# Patient Record
Sex: Male | Born: 1980
Health system: Southern US, Community
[De-identification: ages and names within clinical notes are randomized; demographics above are authoritative.]

## PROBLEM LIST (undated history)

## (undated) DIAGNOSIS — T7840XA Allergy, unspecified, initial encounter: Secondary | ICD-10-CM

## (undated) DIAGNOSIS — J342 Deviated nasal septum: Secondary | ICD-10-CM

## (undated) DIAGNOSIS — G43909 Migraine, unspecified, not intractable, without status migrainosus: Secondary | ICD-10-CM

## (undated) DIAGNOSIS — F909 Attention-deficit hyperactivity disorder, unspecified type: Secondary | ICD-10-CM

## (undated) DIAGNOSIS — I10 Essential (primary) hypertension: Secondary | ICD-10-CM

## (undated) HISTORY — DX: Essential (primary) hypertension: I10

## (undated) HISTORY — PX: WISDOM TOOTH EXTRACTION: SHX21

## (undated) HISTORY — DX: Migraine, unspecified, not intractable, without status migrainosus: G43.909

## (undated) HISTORY — DX: Allergy, unspecified, initial encounter: T78.40XA

## (undated) HISTORY — PX: ADENOIDECTOMY: SUR15

## (undated) HISTORY — PX: SALIVARY GLAND SURGERY: SHX768

## (undated) HISTORY — PX: TONSILLECTOMY: SUR1361

---
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
## 2005-11-14 ENCOUNTER — Ambulatory Visit (HOSPITAL_COMMUNITY): Admission: RE | Admit: 2005-11-14 | Discharge: 2005-11-14 | Payer: Self-pay | Admitting: Chiropractic Medicine

## 2009-07-11 ENCOUNTER — Encounter: Admission: RE | Admit: 2009-07-11 | Discharge: 2009-07-11 | Payer: Self-pay | Admitting: Family Medicine

## 2018-05-01 DIAGNOSIS — Z0000 Encounter for general adult medical examination without abnormal findings: Secondary | ICD-10-CM | POA: Diagnosis not present

## 2018-05-08 DIAGNOSIS — Z23 Encounter for immunization: Secondary | ICD-10-CM | POA: Diagnosis not present

## 2018-05-08 DIAGNOSIS — I10 Essential (primary) hypertension: Secondary | ICD-10-CM | POA: Diagnosis not present

## 2018-05-08 DIAGNOSIS — Z0000 Encounter for general adult medical examination without abnormal findings: Secondary | ICD-10-CM | POA: Diagnosis not present

## 2018-05-08 DIAGNOSIS — Z6827 Body mass index (BMI) 27.0-27.9, adult: Secondary | ICD-10-CM | POA: Diagnosis not present

## 2018-06-12 DIAGNOSIS — R17 Unspecified jaundice: Secondary | ICD-10-CM | POA: Diagnosis not present

## 2018-06-12 DIAGNOSIS — I10 Essential (primary) hypertension: Secondary | ICD-10-CM | POA: Diagnosis not present

## 2018-06-19 MED FILL — VALSARTAN 40 MG TABLET: 40 | 7 days supply | Qty: 14 | Fill #0

## 2018-06-26 DIAGNOSIS — R945 Abnormal results of liver function studies: Secondary | ICD-10-CM | POA: Diagnosis not present

## 2018-06-26 DIAGNOSIS — I10 Essential (primary) hypertension: Secondary | ICD-10-CM | POA: Diagnosis not present

## 2018-06-26 DIAGNOSIS — Z6827 Body mass index (BMI) 27.0-27.9, adult: Secondary | ICD-10-CM | POA: Diagnosis not present

## 2018-06-26 MED FILL — VALSARTAN 40 MG TABLET: 40 | 90 days supply | Qty: 180 | Fill #0

## 2018-07-17 DIAGNOSIS — H5213 Myopia, bilateral: Secondary | ICD-10-CM | POA: Diagnosis not present

## 2018-08-07 DIAGNOSIS — M542 Cervicalgia: Secondary | ICD-10-CM | POA: Diagnosis not present

## 2018-08-07 DIAGNOSIS — M546 Pain in thoracic spine: Secondary | ICD-10-CM | POA: Diagnosis not present

## 2018-08-07 DIAGNOSIS — M9905 Segmental and somatic dysfunction of pelvic region: Secondary | ICD-10-CM | POA: Diagnosis not present

## 2018-08-07 DIAGNOSIS — M9903 Segmental and somatic dysfunction of lumbar region: Secondary | ICD-10-CM | POA: Diagnosis not present

## 2018-08-07 DIAGNOSIS — M9901 Segmental and somatic dysfunction of cervical region: Secondary | ICD-10-CM | POA: Diagnosis not present

## 2018-08-07 DIAGNOSIS — M7918 Myalgia, other site: Secondary | ICD-10-CM | POA: Diagnosis not present

## 2018-08-07 DIAGNOSIS — M9902 Segmental and somatic dysfunction of thoracic region: Secondary | ICD-10-CM | POA: Diagnosis not present

## 2018-08-07 DIAGNOSIS — M545 Low back pain: Secondary | ICD-10-CM | POA: Diagnosis not present

## 2018-08-21 DIAGNOSIS — M546 Pain in thoracic spine: Secondary | ICD-10-CM | POA: Diagnosis not present

## 2018-08-21 DIAGNOSIS — M9905 Segmental and somatic dysfunction of pelvic region: Secondary | ICD-10-CM | POA: Diagnosis not present

## 2018-08-21 DIAGNOSIS — M9903 Segmental and somatic dysfunction of lumbar region: Secondary | ICD-10-CM | POA: Diagnosis not present

## 2018-08-21 DIAGNOSIS — M542 Cervicalgia: Secondary | ICD-10-CM | POA: Diagnosis not present

## 2018-08-21 DIAGNOSIS — M9902 Segmental and somatic dysfunction of thoracic region: Secondary | ICD-10-CM | POA: Diagnosis not present

## 2018-08-21 DIAGNOSIS — M9901 Segmental and somatic dysfunction of cervical region: Secondary | ICD-10-CM | POA: Diagnosis not present

## 2018-08-21 DIAGNOSIS — M7918 Myalgia, other site: Secondary | ICD-10-CM | POA: Diagnosis not present

## 2018-08-21 DIAGNOSIS — M545 Low back pain: Secondary | ICD-10-CM | POA: Diagnosis not present

## 2018-09-11 DIAGNOSIS — M9903 Segmental and somatic dysfunction of lumbar region: Secondary | ICD-10-CM | POA: Diagnosis not present

## 2018-09-11 DIAGNOSIS — M9905 Segmental and somatic dysfunction of pelvic region: Secondary | ICD-10-CM | POA: Diagnosis not present

## 2018-09-11 DIAGNOSIS — M546 Pain in thoracic spine: Secondary | ICD-10-CM | POA: Diagnosis not present

## 2018-09-11 DIAGNOSIS — M7918 Myalgia, other site: Secondary | ICD-10-CM | POA: Diagnosis not present

## 2018-09-11 DIAGNOSIS — M9902 Segmental and somatic dysfunction of thoracic region: Secondary | ICD-10-CM | POA: Diagnosis not present

## 2018-09-11 DIAGNOSIS — M9901 Segmental and somatic dysfunction of cervical region: Secondary | ICD-10-CM | POA: Diagnosis not present

## 2018-09-11 DIAGNOSIS — M542 Cervicalgia: Secondary | ICD-10-CM | POA: Diagnosis not present

## 2018-09-11 DIAGNOSIS — M545 Low back pain: Secondary | ICD-10-CM | POA: Diagnosis not present

## 2018-09-22 MED FILL — VALSARTAN 40 MG TABLET: 40 | 90 days supply | Qty: 180 | Fill #1

## 2018-09-25 DIAGNOSIS — M545 Low back pain: Secondary | ICD-10-CM | POA: Diagnosis not present

## 2018-09-25 DIAGNOSIS — M542 Cervicalgia: Secondary | ICD-10-CM | POA: Diagnosis not present

## 2018-09-25 DIAGNOSIS — M9902 Segmental and somatic dysfunction of thoracic region: Secondary | ICD-10-CM | POA: Diagnosis not present

## 2018-09-25 DIAGNOSIS — M9903 Segmental and somatic dysfunction of lumbar region: Secondary | ICD-10-CM | POA: Diagnosis not present

## 2018-09-25 DIAGNOSIS — M9901 Segmental and somatic dysfunction of cervical region: Secondary | ICD-10-CM | POA: Diagnosis not present

## 2018-09-25 DIAGNOSIS — M546 Pain in thoracic spine: Secondary | ICD-10-CM | POA: Diagnosis not present

## 2018-09-25 DIAGNOSIS — M9905 Segmental and somatic dysfunction of pelvic region: Secondary | ICD-10-CM | POA: Diagnosis not present

## 2018-09-25 DIAGNOSIS — M7918 Myalgia, other site: Secondary | ICD-10-CM | POA: Diagnosis not present

## 2018-10-16 DIAGNOSIS — M9905 Segmental and somatic dysfunction of pelvic region: Secondary | ICD-10-CM | POA: Diagnosis not present

## 2018-10-16 DIAGNOSIS — M9901 Segmental and somatic dysfunction of cervical region: Secondary | ICD-10-CM | POA: Diagnosis not present

## 2018-10-16 DIAGNOSIS — M546 Pain in thoracic spine: Secondary | ICD-10-CM | POA: Diagnosis not present

## 2018-10-16 DIAGNOSIS — M545 Low back pain: Secondary | ICD-10-CM | POA: Diagnosis not present

## 2018-10-16 DIAGNOSIS — M542 Cervicalgia: Secondary | ICD-10-CM | POA: Diagnosis not present

## 2018-10-16 DIAGNOSIS — M7918 Myalgia, other site: Secondary | ICD-10-CM | POA: Diagnosis not present

## 2018-10-16 DIAGNOSIS — M9902 Segmental and somatic dysfunction of thoracic region: Secondary | ICD-10-CM | POA: Diagnosis not present

## 2018-10-16 DIAGNOSIS — M9903 Segmental and somatic dysfunction of lumbar region: Secondary | ICD-10-CM | POA: Diagnosis not present

## 2018-11-06 DIAGNOSIS — M546 Pain in thoracic spine: Secondary | ICD-10-CM | POA: Diagnosis not present

## 2018-11-06 DIAGNOSIS — M542 Cervicalgia: Secondary | ICD-10-CM | POA: Diagnosis not present

## 2018-11-06 DIAGNOSIS — M9905 Segmental and somatic dysfunction of pelvic region: Secondary | ICD-10-CM | POA: Diagnosis not present

## 2018-11-06 DIAGNOSIS — M7918 Myalgia, other site: Secondary | ICD-10-CM | POA: Diagnosis not present

## 2018-11-06 DIAGNOSIS — M9902 Segmental and somatic dysfunction of thoracic region: Secondary | ICD-10-CM | POA: Diagnosis not present

## 2018-11-06 DIAGNOSIS — M9903 Segmental and somatic dysfunction of lumbar region: Secondary | ICD-10-CM | POA: Diagnosis not present

## 2018-11-06 DIAGNOSIS — M9901 Segmental and somatic dysfunction of cervical region: Secondary | ICD-10-CM | POA: Diagnosis not present

## 2018-11-06 DIAGNOSIS — M545 Low back pain: Secondary | ICD-10-CM | POA: Diagnosis not present

## 2018-11-20 DIAGNOSIS — M9903 Segmental and somatic dysfunction of lumbar region: Secondary | ICD-10-CM | POA: Diagnosis not present

## 2018-11-20 DIAGNOSIS — M542 Cervicalgia: Secondary | ICD-10-CM | POA: Diagnosis not present

## 2018-11-20 DIAGNOSIS — M9901 Segmental and somatic dysfunction of cervical region: Secondary | ICD-10-CM | POA: Diagnosis not present

## 2018-11-20 DIAGNOSIS — M9902 Segmental and somatic dysfunction of thoracic region: Secondary | ICD-10-CM | POA: Diagnosis not present

## 2018-11-20 DIAGNOSIS — M7918 Myalgia, other site: Secondary | ICD-10-CM | POA: Diagnosis not present

## 2018-11-20 DIAGNOSIS — M545 Low back pain: Secondary | ICD-10-CM | POA: Diagnosis not present

## 2018-11-20 DIAGNOSIS — M9905 Segmental and somatic dysfunction of pelvic region: Secondary | ICD-10-CM | POA: Diagnosis not present

## 2018-11-20 DIAGNOSIS — M546 Pain in thoracic spine: Secondary | ICD-10-CM | POA: Diagnosis not present

## 2018-12-11 DIAGNOSIS — M9905 Segmental and somatic dysfunction of pelvic region: Secondary | ICD-10-CM | POA: Diagnosis not present

## 2018-12-11 DIAGNOSIS — M9902 Segmental and somatic dysfunction of thoracic region: Secondary | ICD-10-CM | POA: Diagnosis not present

## 2018-12-11 DIAGNOSIS — M545 Low back pain: Secondary | ICD-10-CM | POA: Diagnosis not present

## 2018-12-11 DIAGNOSIS — M7918 Myalgia, other site: Secondary | ICD-10-CM | POA: Diagnosis not present

## 2018-12-11 DIAGNOSIS — M542 Cervicalgia: Secondary | ICD-10-CM | POA: Diagnosis not present

## 2018-12-11 DIAGNOSIS — M9901 Segmental and somatic dysfunction of cervical region: Secondary | ICD-10-CM | POA: Diagnosis not present

## 2018-12-11 DIAGNOSIS — M546 Pain in thoracic spine: Secondary | ICD-10-CM | POA: Diagnosis not present

## 2018-12-11 DIAGNOSIS — M9903 Segmental and somatic dysfunction of lumbar region: Secondary | ICD-10-CM | POA: Diagnosis not present

## 2018-12-25 DIAGNOSIS — I10 Essential (primary) hypertension: Secondary | ICD-10-CM | POA: Diagnosis not present

## 2018-12-25 DIAGNOSIS — M546 Pain in thoracic spine: Secondary | ICD-10-CM | POA: Diagnosis not present

## 2018-12-25 DIAGNOSIS — M9901 Segmental and somatic dysfunction of cervical region: Secondary | ICD-10-CM | POA: Diagnosis not present

## 2018-12-25 DIAGNOSIS — M9902 Segmental and somatic dysfunction of thoracic region: Secondary | ICD-10-CM | POA: Diagnosis not present

## 2018-12-25 DIAGNOSIS — M9905 Segmental and somatic dysfunction of pelvic region: Secondary | ICD-10-CM | POA: Diagnosis not present

## 2018-12-25 DIAGNOSIS — M545 Low back pain: Secondary | ICD-10-CM | POA: Diagnosis not present

## 2018-12-25 DIAGNOSIS — M9903 Segmental and somatic dysfunction of lumbar region: Secondary | ICD-10-CM | POA: Diagnosis not present

## 2018-12-25 DIAGNOSIS — M7918 Myalgia, other site: Secondary | ICD-10-CM | POA: Diagnosis not present

## 2018-12-25 DIAGNOSIS — E785 Hyperlipidemia, unspecified: Secondary | ICD-10-CM | POA: Diagnosis not present

## 2018-12-25 DIAGNOSIS — M542 Cervicalgia: Secondary | ICD-10-CM | POA: Diagnosis not present

## 2018-12-28 MED FILL — VALSARTAN 40 MG TABLET: 40 | 5 days supply | Qty: 10 | Fill #0

## 2019-01-01 MED FILL — VALSARTAN 40 MG TABS: 40 | 15 days supply | Qty: 30 | Fill #0

## 2019-01-15 DIAGNOSIS — I10 Essential (primary) hypertension: Secondary | ICD-10-CM | POA: Diagnosis not present

## 2019-01-15 DIAGNOSIS — T7840XS Allergy, unspecified, sequela: Secondary | ICD-10-CM | POA: Diagnosis not present

## 2019-01-15 DIAGNOSIS — R945 Abnormal results of liver function studies: Secondary | ICD-10-CM | POA: Diagnosis not present

## 2019-01-15 DIAGNOSIS — T7840XA Allergy, unspecified, initial encounter: Secondary | ICD-10-CM | POA: Diagnosis not present

## 2019-01-15 DIAGNOSIS — Z6827 Body mass index (BMI) 27.0-27.9, adult: Secondary | ICD-10-CM | POA: Diagnosis not present

## 2019-01-15 DIAGNOSIS — E782 Mixed hyperlipidemia: Secondary | ICD-10-CM | POA: Diagnosis not present

## 2019-01-15 MED FILL — VALSARTAN 40 MG TABLET: 40 | 30 days supply | Qty: 60 | Fill #0

## 2019-02-05 DIAGNOSIS — M9901 Segmental and somatic dysfunction of cervical region: Secondary | ICD-10-CM | POA: Diagnosis not present

## 2019-02-05 DIAGNOSIS — M9902 Segmental and somatic dysfunction of thoracic region: Secondary | ICD-10-CM | POA: Diagnosis not present

## 2019-02-05 DIAGNOSIS — M545 Low back pain: Secondary | ICD-10-CM | POA: Diagnosis not present

## 2019-02-05 DIAGNOSIS — M546 Pain in thoracic spine: Secondary | ICD-10-CM | POA: Diagnosis not present

## 2019-02-05 DIAGNOSIS — M542 Cervicalgia: Secondary | ICD-10-CM | POA: Diagnosis not present

## 2019-02-05 DIAGNOSIS — M9905 Segmental and somatic dysfunction of pelvic region: Secondary | ICD-10-CM | POA: Diagnosis not present

## 2019-02-05 DIAGNOSIS — M9903 Segmental and somatic dysfunction of lumbar region: Secondary | ICD-10-CM | POA: Diagnosis not present

## 2019-02-05 DIAGNOSIS — M7918 Myalgia, other site: Secondary | ICD-10-CM | POA: Diagnosis not present

## 2019-02-15 MED FILL — VALSARTAN 40 MG TABLET: 40 | 30 days supply | Qty: 60 | Fill #1 | Status: TO

## 2019-03-15 MED FILL — VALSARTAN 40 MG TABLET: 40 | 30 days supply | Qty: 60 | Fill #0

## 2019-04-16 DIAGNOSIS — Z03818 Encounter for observation for suspected exposure to other biological agents ruled out: Secondary | ICD-10-CM | POA: Diagnosis not present

## 2019-04-19 MED FILL — LOSARTAN POTASSIUM 50 MG TA: 50 | 90 days supply | Qty: 90 | Fill #0

## 2019-05-14 DIAGNOSIS — R945 Abnormal results of liver function studies: Secondary | ICD-10-CM | POA: Diagnosis not present

## 2019-05-14 DIAGNOSIS — Z719 Counseling, unspecified: Secondary | ICD-10-CM | POA: Diagnosis not present

## 2019-05-14 DIAGNOSIS — I10 Essential (primary) hypertension: Secondary | ICD-10-CM | POA: Diagnosis not present

## 2019-07-02 DIAGNOSIS — I10 Essential (primary) hypertension: Secondary | ICD-10-CM | POA: Diagnosis not present

## 2019-07-02 DIAGNOSIS — Z7689 Persons encountering health services in other specified circumstances: Secondary | ICD-10-CM | POA: Diagnosis not present

## 2019-07-16 DIAGNOSIS — M9905 Segmental and somatic dysfunction of pelvic region: Secondary | ICD-10-CM | POA: Diagnosis not present

## 2019-07-16 DIAGNOSIS — M9902 Segmental and somatic dysfunction of thoracic region: Secondary | ICD-10-CM | POA: Diagnosis not present

## 2019-07-16 DIAGNOSIS — R945 Abnormal results of liver function studies: Secondary | ICD-10-CM | POA: Diagnosis not present

## 2019-07-16 DIAGNOSIS — E782 Mixed hyperlipidemia: Secondary | ICD-10-CM | POA: Diagnosis not present

## 2019-07-16 DIAGNOSIS — M9907 Segmental and somatic dysfunction of upper extremity: Secondary | ICD-10-CM | POA: Diagnosis not present

## 2019-07-16 DIAGNOSIS — I10 Essential (primary) hypertension: Secondary | ICD-10-CM | POA: Diagnosis not present

## 2019-07-16 DIAGNOSIS — M542 Cervicalgia: Secondary | ICD-10-CM | POA: Diagnosis not present

## 2019-07-16 DIAGNOSIS — M7918 Myalgia, other site: Secondary | ICD-10-CM | POA: Diagnosis not present

## 2019-07-16 DIAGNOSIS — M9903 Segmental and somatic dysfunction of lumbar region: Secondary | ICD-10-CM | POA: Diagnosis not present

## 2019-07-16 DIAGNOSIS — M9901 Segmental and somatic dysfunction of cervical region: Secondary | ICD-10-CM | POA: Diagnosis not present

## 2019-07-16 DIAGNOSIS — M545 Low back pain: Secondary | ICD-10-CM | POA: Diagnosis not present

## 2019-07-16 DIAGNOSIS — M546 Pain in thoracic spine: Secondary | ICD-10-CM | POA: Diagnosis not present

## 2019-07-19 MED FILL — LOSARTAN POTASSIUM 50 MG TA: 50 | 90 days supply | Qty: 90 | Fill #0

## 2019-07-30 DIAGNOSIS — H5213 Myopia, bilateral: Secondary | ICD-10-CM | POA: Diagnosis not present

## 2019-09-26 DIAGNOSIS — Z20828 Contact with and (suspected) exposure to other viral communicable diseases: Secondary | ICD-10-CM | POA: Diagnosis not present

## 2019-10-22 MED FILL — LOSARTAN POTASSIUM 50 MG TA: 50 | 90 days supply | Qty: 90 | Fill #1

## 2020-01-19 MED FILL — LOSARTAN POTASSIUM 50 MG TA: 50 | 90 days supply | Qty: 90 | Fill #2

## 2020-02-25 DIAGNOSIS — M5032 Other cervical disc degeneration, mid-cervical region, unspecified level: Secondary | ICD-10-CM | POA: Diagnosis not present

## 2020-02-25 DIAGNOSIS — M5134 Other intervertebral disc degeneration, thoracic region: Secondary | ICD-10-CM | POA: Diagnosis not present

## 2020-02-25 DIAGNOSIS — M5137 Other intervertebral disc degeneration, lumbosacral region: Secondary | ICD-10-CM | POA: Diagnosis not present

## 2020-02-25 DIAGNOSIS — M9901 Segmental and somatic dysfunction of cervical region: Secondary | ICD-10-CM | POA: Diagnosis not present

## 2020-02-25 DIAGNOSIS — M9903 Segmental and somatic dysfunction of lumbar region: Secondary | ICD-10-CM | POA: Diagnosis not present

## 2020-02-25 DIAGNOSIS — M9902 Segmental and somatic dysfunction of thoracic region: Secondary | ICD-10-CM | POA: Diagnosis not present

## 2020-02-25 DIAGNOSIS — M62830 Muscle spasm of back: Secondary | ICD-10-CM | POA: Diagnosis not present

## 2020-03-03 DIAGNOSIS — M9901 Segmental and somatic dysfunction of cervical region: Secondary | ICD-10-CM | POA: Diagnosis not present

## 2020-03-03 DIAGNOSIS — M9902 Segmental and somatic dysfunction of thoracic region: Secondary | ICD-10-CM | POA: Diagnosis not present

## 2020-03-03 DIAGNOSIS — M9903 Segmental and somatic dysfunction of lumbar region: Secondary | ICD-10-CM | POA: Diagnosis not present

## 2020-03-03 DIAGNOSIS — M5137 Other intervertebral disc degeneration, lumbosacral region: Secondary | ICD-10-CM | POA: Diagnosis not present

## 2020-03-03 DIAGNOSIS — M5032 Other cervical disc degeneration, mid-cervical region, unspecified level: Secondary | ICD-10-CM | POA: Diagnosis not present

## 2020-03-03 DIAGNOSIS — M5134 Other intervertebral disc degeneration, thoracic region: Secondary | ICD-10-CM | POA: Diagnosis not present

## 2020-03-03 DIAGNOSIS — M62830 Muscle spasm of back: Secondary | ICD-10-CM | POA: Diagnosis not present

## 2020-03-10 DIAGNOSIS — M9901 Segmental and somatic dysfunction of cervical region: Secondary | ICD-10-CM | POA: Diagnosis not present

## 2020-03-10 DIAGNOSIS — M9903 Segmental and somatic dysfunction of lumbar region: Secondary | ICD-10-CM | POA: Diagnosis not present

## 2020-03-10 DIAGNOSIS — M5032 Other cervical disc degeneration, mid-cervical region, unspecified level: Secondary | ICD-10-CM | POA: Diagnosis not present

## 2020-03-10 DIAGNOSIS — M62830 Muscle spasm of back: Secondary | ICD-10-CM | POA: Diagnosis not present

## 2020-03-10 DIAGNOSIS — M5134 Other intervertebral disc degeneration, thoracic region: Secondary | ICD-10-CM | POA: Diagnosis not present

## 2020-03-10 DIAGNOSIS — M5137 Other intervertebral disc degeneration, lumbosacral region: Secondary | ICD-10-CM | POA: Diagnosis not present

## 2020-03-10 DIAGNOSIS — M9902 Segmental and somatic dysfunction of thoracic region: Secondary | ICD-10-CM | POA: Diagnosis not present

## 2020-03-17 DIAGNOSIS — M9901 Segmental and somatic dysfunction of cervical region: Secondary | ICD-10-CM | POA: Diagnosis not present

## 2020-03-17 DIAGNOSIS — M9903 Segmental and somatic dysfunction of lumbar region: Secondary | ICD-10-CM | POA: Diagnosis not present

## 2020-03-17 DIAGNOSIS — M5032 Other cervical disc degeneration, mid-cervical region, unspecified level: Secondary | ICD-10-CM | POA: Diagnosis not present

## 2020-03-17 DIAGNOSIS — M5137 Other intervertebral disc degeneration, lumbosacral region: Secondary | ICD-10-CM | POA: Diagnosis not present

## 2020-03-17 DIAGNOSIS — M9902 Segmental and somatic dysfunction of thoracic region: Secondary | ICD-10-CM | POA: Diagnosis not present

## 2020-03-17 DIAGNOSIS — M5134 Other intervertebral disc degeneration, thoracic region: Secondary | ICD-10-CM | POA: Diagnosis not present

## 2020-03-17 DIAGNOSIS — M62830 Muscle spasm of back: Secondary | ICD-10-CM | POA: Diagnosis not present

## 2020-03-24 DIAGNOSIS — M9902 Segmental and somatic dysfunction of thoracic region: Secondary | ICD-10-CM | POA: Diagnosis not present

## 2020-03-24 DIAGNOSIS — M5134 Other intervertebral disc degeneration, thoracic region: Secondary | ICD-10-CM | POA: Diagnosis not present

## 2020-03-24 DIAGNOSIS — M5137 Other intervertebral disc degeneration, lumbosacral region: Secondary | ICD-10-CM | POA: Diagnosis not present

## 2020-03-24 DIAGNOSIS — M9901 Segmental and somatic dysfunction of cervical region: Secondary | ICD-10-CM | POA: Diagnosis not present

## 2020-03-24 DIAGNOSIS — M62830 Muscle spasm of back: Secondary | ICD-10-CM | POA: Diagnosis not present

## 2020-03-24 DIAGNOSIS — M5032 Other cervical disc degeneration, mid-cervical region, unspecified level: Secondary | ICD-10-CM | POA: Diagnosis not present

## 2020-03-24 DIAGNOSIS — M9903 Segmental and somatic dysfunction of lumbar region: Secondary | ICD-10-CM | POA: Diagnosis not present

## 2020-03-31 DIAGNOSIS — M9902 Segmental and somatic dysfunction of thoracic region: Secondary | ICD-10-CM | POA: Diagnosis not present

## 2020-03-31 DIAGNOSIS — M9903 Segmental and somatic dysfunction of lumbar region: Secondary | ICD-10-CM | POA: Diagnosis not present

## 2020-03-31 DIAGNOSIS — M5137 Other intervertebral disc degeneration, lumbosacral region: Secondary | ICD-10-CM | POA: Diagnosis not present

## 2020-03-31 DIAGNOSIS — M5032 Other cervical disc degeneration, mid-cervical region, unspecified level: Secondary | ICD-10-CM | POA: Diagnosis not present

## 2020-03-31 DIAGNOSIS — M5134 Other intervertebral disc degeneration, thoracic region: Secondary | ICD-10-CM | POA: Diagnosis not present

## 2020-03-31 DIAGNOSIS — M62830 Muscle spasm of back: Secondary | ICD-10-CM | POA: Diagnosis not present

## 2020-03-31 DIAGNOSIS — M9901 Segmental and somatic dysfunction of cervical region: Secondary | ICD-10-CM | POA: Diagnosis not present

## 2020-04-07 DIAGNOSIS — M9903 Segmental and somatic dysfunction of lumbar region: Secondary | ICD-10-CM | POA: Diagnosis not present

## 2020-04-07 DIAGNOSIS — M5032 Other cervical disc degeneration, mid-cervical region, unspecified level: Secondary | ICD-10-CM | POA: Diagnosis not present

## 2020-04-07 DIAGNOSIS — M9901 Segmental and somatic dysfunction of cervical region: Secondary | ICD-10-CM | POA: Diagnosis not present

## 2020-04-07 DIAGNOSIS — M9902 Segmental and somatic dysfunction of thoracic region: Secondary | ICD-10-CM | POA: Diagnosis not present

## 2020-04-07 DIAGNOSIS — M62830 Muscle spasm of back: Secondary | ICD-10-CM | POA: Diagnosis not present

## 2020-04-07 DIAGNOSIS — M5134 Other intervertebral disc degeneration, thoracic region: Secondary | ICD-10-CM | POA: Diagnosis not present

## 2020-04-07 DIAGNOSIS — M5137 Other intervertebral disc degeneration, lumbosacral region: Secondary | ICD-10-CM | POA: Diagnosis not present

## 2020-04-21 MED FILL — LOSARTAN POTASSIUM 50 MG TA: 50 | 90 days supply | Qty: 90 | Fill #3

## 2020-05-09 ENCOUNTER — Telehealth: Payer: Self-pay | Admitting: General Practice

## 2020-05-09 NOTE — Telephone Encounter (Signed)
Patient scheduled.

## 2020-05-09 NOTE — Telephone Encounter (Signed)
Pt called - he had a new pt appt / CPE scheduled in June - we called to reschedule due to Dr Jimmey Ralph not being here.  His appt was rescheduled for July but said that if he waited that long, then his insurance will go up.  Dr Jimmey Ralph has a lot of back to back virtual slots - I wanted to see if we could somehow help him out since its not his fault the appt had to get rescheduled.  He knows Dr Jimmey Ralph isn't here this week and that it may be Monday or Tues before he hears back from [place].

## 2020-05-12 DIAGNOSIS — M9902 Segmental and somatic dysfunction of thoracic region: Secondary | ICD-10-CM | POA: Diagnosis not present

## 2020-05-12 DIAGNOSIS — M5134 Other intervertebral disc degeneration, thoracic region: Secondary | ICD-10-CM | POA: Diagnosis not present

## 2020-05-12 DIAGNOSIS — M62830 Muscle spasm of back: Secondary | ICD-10-CM | POA: Diagnosis not present

## 2020-05-12 DIAGNOSIS — M5032 Other cervical disc degeneration, mid-cervical region, unspecified level: Secondary | ICD-10-CM | POA: Diagnosis not present

## 2020-05-12 DIAGNOSIS — M5137 Other intervertebral disc degeneration, lumbosacral region: Secondary | ICD-10-CM | POA: Diagnosis not present

## 2020-05-12 DIAGNOSIS — M9901 Segmental and somatic dysfunction of cervical region: Secondary | ICD-10-CM | POA: Diagnosis not present

## 2020-05-12 DIAGNOSIS — M9903 Segmental and somatic dysfunction of lumbar region: Secondary | ICD-10-CM | POA: Diagnosis not present

## 2020-05-16 ENCOUNTER — Encounter: Payer: Self-pay | Admitting: Family Medicine

## 2020-05-16 ENCOUNTER — Other Ambulatory Visit: Payer: Self-pay

## 2020-05-16 ENCOUNTER — Ambulatory Visit: Payer: 59 | Admitting: Family Medicine

## 2020-05-16 VITALS — BP 152/90 | HR 75 | Temp 98.2°F | Ht 71.0 in | Wt 199.2 lb

## 2020-05-16 DIAGNOSIS — Z6827 Body mass index (BMI) 27.0-27.9, adult: Secondary | ICD-10-CM

## 2020-05-16 DIAGNOSIS — Z0001 Encounter for general adult medical examination with abnormal findings: Secondary | ICD-10-CM | POA: Diagnosis not present

## 2020-05-16 DIAGNOSIS — G43809 Other migraine, not intractable, without status migrainosus: Secondary | ICD-10-CM | POA: Diagnosis not present

## 2020-05-16 DIAGNOSIS — I10 Essential (primary) hypertension: Secondary | ICD-10-CM

## 2020-05-16 DIAGNOSIS — E663 Overweight: Secondary | ICD-10-CM

## 2020-05-16 DIAGNOSIS — J302 Other seasonal allergic rhinitis: Secondary | ICD-10-CM | POA: Diagnosis not present

## 2020-05-16 DIAGNOSIS — Z13220 Encounter for screening for lipoid disorders: Secondary | ICD-10-CM | POA: Diagnosis not present

## 2020-05-16 LAB — COMPREHENSIVE METABOLIC PANEL
ALT: 24 U/L (ref 0–53)
AST: 21 U/L (ref 0–37)
Albumin: 4.8 g/dL (ref 3.5–5.2)
Alkaline Phosphatase: 45 U/L (ref 39–117)
BUN: 19 mg/dL (ref 6–23)
CO2: 27 mEq/L (ref 19–32)
Calcium: 9.4 mg/dL (ref 8.4–10.5)
Chloride: 107 mEq/L (ref 96–112)
Creatinine, Ser: 1.09 mg/dL (ref 0.40–1.50)
GFR: 76.27 mL/min (ref >60.00)
Glucose, Bld: 98 mg/dL (ref 70–99)
Potassium: 4.1 mEq/L (ref 3.5–5.1)
Sodium: 140 mEq/L (ref 135–145)
Total Bilirubin: 1.6 mg/dL — ABNORMAL HIGH (ref 0.2–1.2)
Total Protein: 6.8 g/dL (ref 6.0–8.3)

## 2020-05-16 LAB — TSH: TSH: 1.41 u[IU]/mL (ref 0.35–4.50)

## 2020-05-16 LAB — LIPID PANEL
Cholesterol: 162 mg/dL (ref 0–200)
HDL: 46.3 mg/dL (ref >39.00)
LDL Cholesterol: 96 mg/dL (ref 0–99)
NonHDL: 115.53
Total CHOL/HDL Ratio: 3
Triglycerides: 98 mg/dL (ref 0.0–149.0)
VLDL: 19.6 mg/dL (ref 0.0–40.0)

## 2020-05-16 LAB — CBC
HCT: 39.9 % (ref 39.0–52.0)
Hemoglobin: 13.6 g/dL (ref 13.0–17.0)
MCHC: 34 g/dL (ref 30.0–36.0)
MCV: 87.6 fl (ref 78.0–100.0)
Platelets: 254 10*3/uL (ref 150.0–400.0)
RBC: 4.55 Mil/uL (ref 4.22–5.81)
RDW: 13.4 % (ref 11.5–15.5)
WBC: 6.4 10*3/uL (ref 4.0–10.5)

## 2020-05-16 NOTE — Assessment & Plan Note (Signed)
Above goal today however has typically well controlled at work and at home.  Will check CBC, C met, TSH.  Continue home monitoring goal 140/90 or lower.  He would like to eventually wean off medications and is working on diet and exercise to help him achieve this goal.

## 2020-05-16 NOTE — Patient Instructions (Signed)
It was very nice to see you today!  We will check blood work today.  Keep in on your blood pressure and let me know if persistently 140/90 or higher.  Come back in year for your next checkup, sooner if needed.  Take care, Dr Jerline Pain  Please try these tips to maintain a healthy lifestyle:   Eat at least 3 REAL meals and 1-2 snacks per day.  Aim for no more than 5 hours between eating.  If you eat breakfast, please do so within one hour of getting up.    Each meal should contain half fruits/vegetables, one quarter protein, and one quarter carbs (no bigger than a computer mouse)   Cut down on sweet beverages. This includes juice, soda, and sweet tea.     Drink at least 1 glass of water with each meal and aim for at least 8 glasses per day   Exercise at least 150 minutes every week.    Preventive Care 37-37 Years Old, Male Preventive care refers to lifestyle choices and visits with your health care provider that can promote health and wellness. This includes:  A yearly physical exam. This is also called an annual well check.  Regular dental and eye exams.  Immunizations.  Screening for certain conditions.  Healthy lifestyle choices, such as eating a healthy diet, getting regular exercise, not using drugs or products that contain nicotine and tobacco, and limiting alcohol use. What can I expect for my preventive care visit? Physical exam Your health care provider will check:  Height and weight. These may be used to calculate body mass index (BMI), which is a measurement that tells if you are at a healthy weight.  Heart rate and blood pressure.  Your skin for abnormal spots. Counseling Your health care provider may ask you questions about:  Alcohol, tobacco, and drug use.  Emotional well-being.  Home and relationship well-being.  Sexual activity.  Eating habits.  Work and work Statistician. What immunizations do I need?  Influenza (flu) vaccine  This is  recommended every year. Tetanus, diphtheria, and pertussis (Tdap) vaccine  You may need a Td booster every 10 years. Varicella (chickenpox) vaccine  You may need this vaccine if you have not already been vaccinated. Human papillomavirus (HPV) vaccine  If recommended by your health care provider, you may need three doses over 6 months. Measles, mumps, and rubella (MMR) vaccine  You may need at least one dose of MMR. You may also need a second dose. Meningococcal conjugate (MenACWY) vaccine  One dose is recommended if you are 37-37 years old and a Market researcher living in a residence hall, or if you have one of several medical conditions. You may also need additional booster doses. Pneumococcal conjugate (PCV13) vaccine  You may need this if you have certain conditions and were not previously vaccinated. Pneumococcal polysaccharide (PPSV23) vaccine  You may need one or two doses if you smoke cigarettes or if you have certain conditions. Hepatitis A vaccine  You may need this if you have certain conditions or if you travel or work in places where you may be exposed to hepatitis A. Hepatitis B vaccine  You may need this if you have certain conditions or if you travel or work in places where you may be exposed to hepatitis B. Haemophilus influenzae type b (Hib) vaccine  You may need this if you have certain risk factors. You may receive vaccines as individual doses or as more than one vaccine together in  one shot (combination vaccines). Talk with your health care provider about the risks and benefits of combination vaccines. What tests do I need? Blood tests  Lipid and cholesterol levels. These may be checked every 5 years starting at age 37.  Hepatitis C test.  Hepatitis B test. Screening   Diabetes screening. This is done by checking your blood sugar (glucose) after you have not eaten for a while (fasting).  Sexually transmitted disease (STD) testing. Talk with  your health care provider about your test results, treatment options, and if necessary, the need for more tests. Follow these instructions at home: Eating and drinking   Eat a diet that includes fresh fruits and vegetables, whole grains, lean protein, and low-fat dairy products.  Take vitamin and mineral supplements as recommended by your health care provider.  Do not drink alcohol if your health care provider tells you not to drink.  If you drink alcohol: ? Limit how much you have to 0-2 drinks a day. ? Be aware of how much alcohol is in your drink. In the [place] one drink equals one 12 oz bottle of beer (355 mL), one 5 oz glass of wine (148 mL), or one 1 oz glass of hard liquor (44 mL). Lifestyle  Take daily care of your teeth and gums.  Stay active. Exercise for at least 30 minutes on 5 or more days each week.  Do not use any products that contain nicotine or tobacco, such as cigarettes, e-cigarettes, and chewing tobacco. If you need help quitting, ask your health care provider.  If you are sexually active, practice safe sex. Use a condom or other form of protection to prevent STIs (sexually transmitted infections). What's next?  Go to your health care provider once a year for a well check visit.  Ask your health care provider how often you should have your eyes and teeth checked.  Stay up to date on all vaccines. This information is not intended to replace advice given to you by your health care provider. Make sure you discuss any questions you have with your health care provider. Document Revised: 11/12/2018 Document Reviewed: 11/12/2018 Elsevier Patient Education  2020 [employer].

## 2020-05-16 NOTE — Progress Notes (Signed)
Chief Complaint:  Jason [name] is a 39 y.o. male who presents today for his annual comprehensive physical exam.    Assessment/Plan:  Chronic Problems Addressed Today: Cervicogenic migraine Stable.  Has seen chiropractor which seems to be helping.  Seasonal allergies Stable.  Can use over-the-counter meds as needed.  Essential hypertension Above goal today however has typically well controlled at work and at home.  Will check CBC, C met, TSH.  Continue home monitoring goal 140/90 or lower.  He would like to eventually wean off medications and is working on diet and exercise to help him achieve this goal.   Body mass index is 27.78 kg/m. / Overweight  BMI Metric Follow Up - 05/16/20 1153      BMI Metric Follow Up-Please document annually   BMI Metric Follow Up Education provided            Preventative Healthcare: Check CBC, C met, TSH, lipid panel.  Patient Counseling(The following topics were reviewed and/or handout was given):  -Nutrition: Stressed importance of moderation in sodium/caffeine intake, saturated fat and cholesterol, caloric balance, sufficient intake of fresh fruits, vegetables, and fiber.  -Stressed the importance of regular exercise.   -Substance Abuse: Discussed cessation/primary prevention of tobacco, alcohol, or other drug use; driving or other dangerous activities under the influence; availability of treatment for abuse.   -Injury prevention: Discussed safety belts, safety helmets, smoke detector, smoking near bedding or upholstery.   -Sexuality: Discussed sexually transmitted diseases, partner selection, use of condoms, avoidance of unintended pregnancy and contraceptive alternatives.   -Dental health: Discussed importance of regular tooth brushing, flossing, and dental visits.  -Health maintenance and immunizations reviewed. Please refer to Health maintenance section.  Return to care in 1 year for next preventative visit.     Subjective:   HPI:  He has no acute complaints today.   See A/p for status of chronic conditions.   Lifestyle Diet: Balanced. Plenty of fruits and vegetables.  Exercise: Starting to work out more. Working orange of theory.   No flowsheet data found.  Health Maintenance Due  Topic Date Due  . Hepatitis C Screening  Never done  . HIV Screening  Never done     ROS: Per HPI, otherwise a complete review of systems was negative.   PMH:  The following were reviewed and entered/updated in epic: Past Medical History:  Diagnosis Date  . Allergy   . Hypertension   . Migraine    Patient Active Problem List   Diagnosis Date Noted  . Essential hypertension 05/16/2020  . Seasonal allergies 05/16/2020  . 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   Past Surgical History:  Procedure Laterality Date  . ADENOIDECTOMY    . SALIVARY GLAND SURGERY    . TONSILLECTOMY    . WISDOM TOOTH EXTRACTION      Family History  Problem Relation Age of Onset  . Skin cancer Father   . Stroke Paternal Grandmother   . Stroke Paternal Grandfather   . Parkinson's disease Paternal Grandfather     Medications- reviewed and updated Current Outpatient Medications  Medication Sig Dispense Refill  . fluticasone (FLONASE) 50 MCG/ACT nasal spray Place into both nostrils daily.    Marland Kitchen loratadine (CLARITIN) 10 MG tablet Take 10 mg by mouth daily.    Marland Kitchen losartan (COZAAR) 50 MG tablet Take 50 mg by mouth daily.    . Melatonin ([facility] MELATONIN) 5 MG CAPS Take by mouth as needed.    . Multiple Vitamin (MULTIVITAMIN ADULT PO) Take  by mouth.     No current facility-administered medications for this visit.    Allergies-reviewed and updated Allergies  Allergen Reactions  . Penicillins     Social History   Socioeconomic History  . Marital status: Married    Spouse name: Not on file  . Number of children: Not on file  . Years of education: Not on file  . Highest education level: Not on file  Occupational History  .  Occupation: Pharmacist, community  Tobacco Use  . Smoking status: Never Smoker  . Smokeless tobacco: Never Used  Vaping Use  . Vaping Use: Never used  Substance and Sexual Activity  . Alcohol use: Yes  . Drug use: Not on file  . Sexual activity: Not on file  Other Topics Concern  . Not on file  Social History Narrative  . Not on file   Social Determinants of Health   Financial Resource Strain:   . Difficulty of Paying Living Expenses:   Food Insecurity:   . Worried About Charity fundraiser in the Last Year:   . Arboriculturist in the Last Year:   Transportation Needs:   . Film/video editor (Medical):   Marland Kitchen Lack of Transportation (Non-Medical):   Physical Activity:   . Days of Exercise per Week:   . Minutes of Exercise per Session:   Stress:   . Feeling of Stress :   Social Connections:   . Frequency of Communication with Friends and Family:   . Frequency of Social Gatherings with Friends and Family:   . Attends Religious Services:   . Active Member of Clubs or Organizations:   . Attends Archivist Meetings:   Marland Kitchen Marital Status:         Objective:  Physical Exam: BP (!) 152/90   Pulse 75   Temp 98.2 F (36.8 C)   Ht 5' 11"  (1.803 m)   Wt 199 lb 3.2 oz (90.4 kg)   SpO2 98%   BMI 27.78 kg/m   Body mass index is 27.78 kg/m. Wt Readings from Last 3 Encounters:  05/16/20 199 lb 3.2 oz (90.4 kg)   Gen: NAD, resting comfortably HEENT: TMs normal bilaterally. OP clear. No thyromegaly noted.  CV: RRR with no murmurs appreciated Pulm: NWOB, CTAB with no crackles, wheezes, or rhonchi GI: Normal bowel sounds present. Soft, Nontender, Nondistended. MSK: no edema, cyanosis, or clubbing noted Skin: warm, dry Neuro: CN2-12 grossly intact. Strength 5/5 in upper and lower extremities. Reflexes symmetric and intact bilaterally.  Psych: Normal affect and thought content     Felicha Frayne M. Jerline Pain, MD 05/16/2020 11:53 AM

## 2020-05-16 NOTE — Assessment & Plan Note (Signed)
Stable.  Can use over-the-counter meds as needed.

## 2020-05-16 NOTE — Assessment & Plan Note (Signed)
Stable.  Has seen chiropractor which seems to be helping.

## 2020-05-17 ENCOUNTER — Other Ambulatory Visit: Payer: Self-pay

## 2020-05-17 DIAGNOSIS — R17 Unspecified jaundice: Secondary | ICD-10-CM

## 2020-05-17 NOTE — Progress Notes (Signed)
Please inform patient of the following:  His bilirubin was up but everything else is NORMAL. Elevated bilirubin is usually something that is hereditary. If he has been told that he has elevated bilirubin in the past we do not need to do anything else. If not, recommended that he come in for fractionated bilirubin to make sure that there is not anything else going on. Please place future order if needed.   Katina Degree. Jimmey Ralph, MD 05/17/2020 8:16 AM

## 2020-05-19 ENCOUNTER — Ambulatory Visit: Payer: Self-pay | Admitting: Family Medicine

## 2020-05-19 ENCOUNTER — Other Ambulatory Visit: Payer: Self-pay

## 2020-05-19 ENCOUNTER — Other Ambulatory Visit (INDEPENDENT_AMBULATORY_CARE_PROVIDER_SITE_OTHER): Payer: 59

## 2020-05-19 DIAGNOSIS — R17 Unspecified jaundice: Secondary | ICD-10-CM

## 2020-05-19 LAB — BILIRUBIN, FRACTIONATED(TOT/DIR/INDIR)
Bilirubin, Direct: 0.3 mg/dL — ABNORMAL HIGH (ref 0.0–0.2)
Indirect Bilirubin: 2.1 mg/dL (calc) — ABNORMAL HIGH (ref 0.2–1.2)
Total Bilirubin: 2.4 mg/dL — ABNORMAL HIGH (ref 0.2–1.2)

## 2020-05-23 NOTE — Progress Notes (Signed)
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 I would like to do one more set of labs to make sure there is nothing else going on. Please place future order for fractionated bilirubin, CBC, peripheral smear, reticulocyte count, haptoglobin, and CMET.

## 2020-05-25 ENCOUNTER — Other Ambulatory Visit: Payer: Self-pay | Admitting: *Deleted

## 2020-05-25 DIAGNOSIS — R17 Unspecified jaundice: Secondary | ICD-10-CM

## 2020-05-26 ENCOUNTER — Other Ambulatory Visit (INDEPENDENT_AMBULATORY_CARE_PROVIDER_SITE_OTHER): Payer: 59

## 2020-05-26 ENCOUNTER — Other Ambulatory Visit: Payer: Self-pay

## 2020-05-26 DIAGNOSIS — R17 Unspecified jaundice: Secondary | ICD-10-CM

## 2020-05-26 LAB — COMPREHENSIVE METABOLIC PANEL
ALT: 17 U/L (ref 0–53)
AST: 13 U/L (ref 0–37)
Albumin: 4.5 g/dL (ref 3.5–5.2)
Alkaline Phosphatase: 45 U/L (ref 39–117)
BUN: 16 mg/dL (ref 6–23)
CO2: 26 mEq/L (ref 19–32)
Calcium: 9.1 mg/dL (ref 8.4–10.5)
Chloride: 106 mEq/L (ref 96–112)
Creatinine, Ser: 1.13 mg/dL (ref 0.40–1.50)
GFR: 73.15 mL/min (ref >60.00)
Glucose, Bld: 83 mg/dL (ref 70–99)
Potassium: 3.9 mEq/L (ref 3.5–5.1)
Sodium: 141 mEq/L (ref 135–145)
Total Bilirubin: 1.8 mg/dL — ABNORMAL HIGH (ref 0.2–1.2)
Total Protein: 6.2 g/dL (ref 6.0–8.3)

## 2020-05-26 LAB — BILIRUBIN, FRACTIONATED(TOT/DIR/INDIR)
Bilirubin, Direct: 0.3 mg/dL — ABNORMAL HIGH (ref 0.0–0.2)
Indirect Bilirubin: 1.9 mg/dL (calc) — ABNORMAL HIGH (ref 0.2–1.2)
Total Bilirubin: 2.2 mg/dL — ABNORMAL HIGH (ref 0.2–1.2)

## 2020-05-26 LAB — CBC WITH DIFFERENTIAL/PLATELET
Basophils Absolute: 0 10*3/uL (ref 0.0–0.1)
Basophils Relative: 0.8 % (ref 0.0–3.0)
Eosinophils Absolute: 0.1 10*3/uL (ref 0.0–0.7)
Eosinophils Relative: 1.7 % (ref 0.0–5.0)
HCT: 40.5 % (ref 39.0–52.0)
Hemoglobin: 13.8 g/dL (ref 13.0–17.0)
Lymphocytes Relative: 29.8 % (ref 12.0–46.0)
Lymphs Abs: 1.8 10*3/uL (ref 0.7–4.0)
MCHC: 34.1 g/dL (ref 30.0–36.0)
MCV: 87.5 fl (ref 78.0–100.0)
Monocytes Absolute: 0.5 10*3/uL (ref 0.1–1.0)
Monocytes Relative: 8.3 % (ref 3.0–12.0)
Neutro Abs: 3.5 10*3/uL (ref 1.4–7.7)
Neutrophils Relative %: 59.4 % (ref 43.0–77.0)
Platelets: 234 10*3/uL (ref 150.0–400.0)
RBC: 4.63 Mil/uL (ref 4.22–5.81)
RDW: 13.4 % (ref 11.5–15.5)
WBC: 5.9 10*3/uL (ref 4.0–10.5)

## 2020-05-26 NOTE — Addendum Note (Signed)
Addended by: Tharon Aquas on: 05/26/2020 08:56 AM   Modules accepted: Orders

## 2020-05-29 LAB — RETICULOCYTES
ABS Retic: 51590 cells/uL (ref 25000–9000)
Retic Ct Pct: 1.1 %

## 2020-05-29 LAB — PATHOLOGIST SMEAR REVIEW

## 2020-05-29 LAB — HAPTOGLOBIN: Haptoglobin: 86 mg/dL (ref 43–212)

## 2020-05-30 ENCOUNTER — Encounter: Payer: Self-pay | Admitting: Family Medicine

## 2020-05-30 NOTE — Progress Notes (Signed)
Please inform patient of the following:  Blood work is all stable. 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 Do not need to do any further testing at this time.  Jason [name]. Jimmey Ralph, MD 05/30/2020 10:41 AM

## 2020-05-31 ENCOUNTER — Encounter: Payer: Self-pay | Admitting: Family Medicine

## 2020-06-09 ENCOUNTER — Ambulatory Visit: Payer: Self-pay | Admitting: Family Medicine

## 2020-06-09 DIAGNOSIS — M5137 Other intervertebral disc degeneration, lumbosacral region: Secondary | ICD-10-CM | POA: Diagnosis not present

## 2020-06-09 DIAGNOSIS — M5134 Other intervertebral disc degeneration, thoracic region: Secondary | ICD-10-CM | POA: Diagnosis not present

## 2020-06-09 DIAGNOSIS — M9902 Segmental and somatic dysfunction of thoracic region: Secondary | ICD-10-CM | POA: Diagnosis not present

## 2020-06-09 DIAGNOSIS — M9903 Segmental and somatic dysfunction of lumbar region: Secondary | ICD-10-CM | POA: Diagnosis not present

## 2020-06-09 DIAGNOSIS — M62830 Muscle spasm of back: Secondary | ICD-10-CM | POA: Diagnosis not present

## 2020-06-09 DIAGNOSIS — M9901 Segmental and somatic dysfunction of cervical region: Secondary | ICD-10-CM | POA: Diagnosis not present

## 2020-06-09 DIAGNOSIS — M5032 Other cervical disc degeneration, mid-cervical region, unspecified level: Secondary | ICD-10-CM | POA: Diagnosis not present

## 2020-06-16 DIAGNOSIS — M62830 Muscle spasm of back: Secondary | ICD-10-CM | POA: Diagnosis not present

## 2020-06-16 DIAGNOSIS — M5134 Other intervertebral disc degeneration, thoracic region: Secondary | ICD-10-CM | POA: Diagnosis not present

## 2020-06-16 DIAGNOSIS — M5032 Other cervical disc degeneration, mid-cervical region, unspecified level: Secondary | ICD-10-CM | POA: Diagnosis not present

## 2020-06-16 DIAGNOSIS — M9901 Segmental and somatic dysfunction of cervical region: Secondary | ICD-10-CM | POA: Diagnosis not present

## 2020-06-16 DIAGNOSIS — M9903 Segmental and somatic dysfunction of lumbar region: Secondary | ICD-10-CM | POA: Diagnosis not present

## 2020-06-16 DIAGNOSIS — M9902 Segmental and somatic dysfunction of thoracic region: Secondary | ICD-10-CM | POA: Diagnosis not present

## 2020-06-16 DIAGNOSIS — M5137 Other intervertebral disc degeneration, lumbosacral region: Secondary | ICD-10-CM | POA: Diagnosis not present

## 2020-06-30 DIAGNOSIS — M5032 Other cervical disc degeneration, mid-cervical region, unspecified level: Secondary | ICD-10-CM | POA: Diagnosis not present

## 2020-06-30 DIAGNOSIS — M62830 Muscle spasm of back: Secondary | ICD-10-CM | POA: Diagnosis not present

## 2020-06-30 DIAGNOSIS — M9902 Segmental and somatic dysfunction of thoracic region: Secondary | ICD-10-CM | POA: Diagnosis not present

## 2020-06-30 DIAGNOSIS — M5137 Other intervertebral disc degeneration, lumbosacral region: Secondary | ICD-10-CM | POA: Diagnosis not present

## 2020-06-30 DIAGNOSIS — M9903 Segmental and somatic dysfunction of lumbar region: Secondary | ICD-10-CM | POA: Diagnosis not present

## 2020-06-30 DIAGNOSIS — M9901 Segmental and somatic dysfunction of cervical region: Secondary | ICD-10-CM | POA: Diagnosis not present

## 2020-06-30 DIAGNOSIS — M5134 Other intervertebral disc degeneration, thoracic region: Secondary | ICD-10-CM | POA: Diagnosis not present

## 2020-07-14 DIAGNOSIS — M5137 Other intervertebral disc degeneration, lumbosacral region: Secondary | ICD-10-CM | POA: Diagnosis not present

## 2020-07-14 DIAGNOSIS — M62830 Muscle spasm of back: Secondary | ICD-10-CM | POA: Diagnosis not present

## 2020-07-14 DIAGNOSIS — M9902 Segmental and somatic dysfunction of thoracic region: Secondary | ICD-10-CM | POA: Diagnosis not present

## 2020-07-14 DIAGNOSIS — M9903 Segmental and somatic dysfunction of lumbar region: Secondary | ICD-10-CM | POA: Diagnosis not present

## 2020-07-14 DIAGNOSIS — M5032 Other cervical disc degeneration, mid-cervical region, unspecified level: Secondary | ICD-10-CM | POA: Diagnosis not present

## 2020-07-14 DIAGNOSIS — M5134 Other intervertebral disc degeneration, thoracic region: Secondary | ICD-10-CM | POA: Diagnosis not present

## 2020-07-14 DIAGNOSIS — M9901 Segmental and somatic dysfunction of cervical region: Secondary | ICD-10-CM | POA: Diagnosis not present

## 2020-07-23 ENCOUNTER — Encounter: Payer: Self-pay | Admitting: Family Medicine

## 2020-07-24 ENCOUNTER — Other Ambulatory Visit: Payer: Self-pay | Admitting: *Deleted

## 2020-07-24 MED ORDER — LOSARTAN POTASSIUM 50 MG PO TABS: 50.0000 mg | ORAL_TABLET | Freq: Every day | ORAL | 0 refills | Status: DC

## 2020-07-24 MED FILL — LOSARTAN POTASSIUM 50 MG TA: 50 | 90 days supply | Qty: 90 | Fill #0

## 2020-07-28 DIAGNOSIS — M5137 Other intervertebral disc degeneration, lumbosacral region: Secondary | ICD-10-CM | POA: Diagnosis not present

## 2020-07-28 DIAGNOSIS — M9902 Segmental and somatic dysfunction of thoracic region: Secondary | ICD-10-CM | POA: Diagnosis not present

## 2020-07-28 DIAGNOSIS — M5134 Other intervertebral disc degeneration, thoracic region: Secondary | ICD-10-CM | POA: Diagnosis not present

## 2020-07-28 DIAGNOSIS — M9901 Segmental and somatic dysfunction of cervical region: Secondary | ICD-10-CM | POA: Diagnosis not present

## 2020-07-28 DIAGNOSIS — M62830 Muscle spasm of back: Secondary | ICD-10-CM | POA: Diagnosis not present

## 2020-07-28 DIAGNOSIS — M9903 Segmental and somatic dysfunction of lumbar region: Secondary | ICD-10-CM | POA: Diagnosis not present

## 2020-07-28 DIAGNOSIS — M5032 Other cervical disc degeneration, mid-cervical region, unspecified level: Secondary | ICD-10-CM | POA: Diagnosis not present

## 2020-08-04 DIAGNOSIS — H5213 Myopia, bilateral: Secondary | ICD-10-CM | POA: Diagnosis not present

## 2020-08-11 DIAGNOSIS — M9901 Segmental and somatic dysfunction of cervical region: Secondary | ICD-10-CM | POA: Diagnosis not present

## 2020-08-11 DIAGNOSIS — M62830 Muscle spasm of back: Secondary | ICD-10-CM | POA: Diagnosis not present

## 2020-08-11 DIAGNOSIS — M9903 Segmental and somatic dysfunction of lumbar region: Secondary | ICD-10-CM | POA: Diagnosis not present

## 2020-08-11 DIAGNOSIS — M5137 Other intervertebral disc degeneration, lumbosacral region: Secondary | ICD-10-CM | POA: Diagnosis not present

## 2020-08-11 DIAGNOSIS — M5134 Other intervertebral disc degeneration, thoracic region: Secondary | ICD-10-CM | POA: Diagnosis not present

## 2020-08-11 DIAGNOSIS — M9902 Segmental and somatic dysfunction of thoracic region: Secondary | ICD-10-CM | POA: Diagnosis not present

## 2020-08-11 DIAGNOSIS — M5032 Other cervical disc degeneration, mid-cervical region, unspecified level: Secondary | ICD-10-CM | POA: Diagnosis not present

## 2020-09-01 DIAGNOSIS — M9903 Segmental and somatic dysfunction of lumbar region: Secondary | ICD-10-CM | POA: Diagnosis not present

## 2020-09-01 DIAGNOSIS — M5137 Other intervertebral disc degeneration, lumbosacral region: Secondary | ICD-10-CM | POA: Diagnosis not present

## 2020-09-01 DIAGNOSIS — M5134 Other intervertebral disc degeneration, thoracic region: Secondary | ICD-10-CM | POA: Diagnosis not present

## 2020-09-01 DIAGNOSIS — M5032 Other cervical disc degeneration, mid-cervical region, unspecified level: Secondary | ICD-10-CM | POA: Diagnosis not present

## 2020-09-01 DIAGNOSIS — M62830 Muscle spasm of back: Secondary | ICD-10-CM | POA: Diagnosis not present

## 2020-09-01 DIAGNOSIS — M9902 Segmental and somatic dysfunction of thoracic region: Secondary | ICD-10-CM | POA: Diagnosis not present

## 2020-09-01 DIAGNOSIS — M9901 Segmental and somatic dysfunction of cervical region: Secondary | ICD-10-CM | POA: Diagnosis not present

## 2020-09-15 DIAGNOSIS — M9901 Segmental and somatic dysfunction of cervical region: Secondary | ICD-10-CM | POA: Diagnosis not present

## 2020-09-15 DIAGNOSIS — M5032 Other cervical disc degeneration, mid-cervical region, unspecified level: Secondary | ICD-10-CM | POA: Diagnosis not present

## 2020-09-15 DIAGNOSIS — M62830 Muscle spasm of back: Secondary | ICD-10-CM | POA: Diagnosis not present

## 2020-09-15 DIAGNOSIS — M9903 Segmental and somatic dysfunction of lumbar region: Secondary | ICD-10-CM | POA: Diagnosis not present

## 2020-09-15 DIAGNOSIS — M5137 Other intervertebral disc degeneration, lumbosacral region: Secondary | ICD-10-CM | POA: Diagnosis not present

## 2020-09-15 DIAGNOSIS — M5134 Other intervertebral disc degeneration, thoracic region: Secondary | ICD-10-CM | POA: Diagnosis not present

## 2020-09-15 DIAGNOSIS — M9902 Segmental and somatic dysfunction of thoracic region: Secondary | ICD-10-CM | POA: Diagnosis not present

## 2020-09-29 DIAGNOSIS — M9902 Segmental and somatic dysfunction of thoracic region: Secondary | ICD-10-CM | POA: Diagnosis not present

## 2020-09-29 DIAGNOSIS — M5134 Other intervertebral disc degeneration, thoracic region: Secondary | ICD-10-CM | POA: Diagnosis not present

## 2020-09-29 DIAGNOSIS — M5137 Other intervertebral disc degeneration, lumbosacral region: Secondary | ICD-10-CM | POA: Diagnosis not present

## 2020-09-29 DIAGNOSIS — M9901 Segmental and somatic dysfunction of cervical region: Secondary | ICD-10-CM | POA: Diagnosis not present

## 2020-09-29 DIAGNOSIS — M5032 Other cervical disc degeneration, mid-cervical region, unspecified level: Secondary | ICD-10-CM | POA: Diagnosis not present

## 2020-09-29 DIAGNOSIS — M62830 Muscle spasm of back: Secondary | ICD-10-CM | POA: Diagnosis not present

## 2020-09-29 DIAGNOSIS — M9903 Segmental and somatic dysfunction of lumbar region: Secondary | ICD-10-CM | POA: Diagnosis not present

## 2020-10-17 ENCOUNTER — Other Ambulatory Visit (HOSPITAL_COMMUNITY): Payer: Self-pay | Admitting: Family Medicine

## 2020-10-17 ENCOUNTER — Encounter: Payer: Self-pay | Admitting: Family Medicine

## 2020-10-17 ENCOUNTER — Other Ambulatory Visit: Payer: Self-pay

## 2020-10-17 MED ORDER — LOSARTAN POTASSIUM 50 MG PO TABS: 50.0000 mg | ORAL_TABLET | Freq: Every day | ORAL | 3 refills | Status: DC

## 2020-10-17 MED FILL — LOSARTAN POTASSIUM 50 MG TA: 50 | 90 days supply | Qty: 90 | Fill #0

## 2020-10-20 DIAGNOSIS — M9903 Segmental and somatic dysfunction of lumbar region: Secondary | ICD-10-CM | POA: Diagnosis not present

## 2020-10-20 DIAGNOSIS — M5137 Other intervertebral disc degeneration, lumbosacral region: Secondary | ICD-10-CM | POA: Diagnosis not present

## 2020-10-20 DIAGNOSIS — M62830 Muscle spasm of back: Secondary | ICD-10-CM | POA: Diagnosis not present

## 2020-10-20 DIAGNOSIS — M5134 Other intervertebral disc degeneration, thoracic region: Secondary | ICD-10-CM | POA: Diagnosis not present

## 2020-10-20 DIAGNOSIS — M5032 Other cervical disc degeneration, mid-cervical region, unspecified level: Secondary | ICD-10-CM | POA: Diagnosis not present

## 2020-10-20 DIAGNOSIS — M9901 Segmental and somatic dysfunction of cervical region: Secondary | ICD-10-CM | POA: Diagnosis not present

## 2020-10-20 DIAGNOSIS — M9902 Segmental and somatic dysfunction of thoracic region: Secondary | ICD-10-CM | POA: Diagnosis not present

## 2021-01-05 DIAGNOSIS — M9901 Segmental and somatic dysfunction of cervical region: Secondary | ICD-10-CM | POA: Diagnosis not present

## 2021-01-05 DIAGNOSIS — M62830 Muscle spasm of back: Secondary | ICD-10-CM | POA: Diagnosis not present

## 2021-01-05 DIAGNOSIS — M5137 Other intervertebral disc degeneration, lumbosacral region: Secondary | ICD-10-CM | POA: Diagnosis not present

## 2021-01-05 DIAGNOSIS — M9903 Segmental and somatic dysfunction of lumbar region: Secondary | ICD-10-CM | POA: Diagnosis not present

## 2021-01-05 DIAGNOSIS — M5134 Other intervertebral disc degeneration, thoracic region: Secondary | ICD-10-CM | POA: Diagnosis not present

## 2021-01-05 DIAGNOSIS — M5032 Other cervical disc degeneration, mid-cervical region, unspecified level: Secondary | ICD-10-CM | POA: Diagnosis not present

## 2021-01-05 DIAGNOSIS — M9902 Segmental and somatic dysfunction of thoracic region: Secondary | ICD-10-CM | POA: Diagnosis not present

## 2021-01-23 MED FILL — LOSARTAN POTASSIUM 50 MG TA: 50 | 30 days supply | Qty: 30 | Fill #1

## 2021-02-26 MED FILL — LOSARTAN POTASSIUM 50 MG TA: 50 | 30 days supply | Qty: 30 | Fill #2

## 2021-03-30 ENCOUNTER — Other Ambulatory Visit (HOSPITAL_COMMUNITY): Payer: Self-pay

## 2021-03-30 MED FILL — Losartan Potassium Tab 50 MG: ORAL | 90 days supply | Qty: 90 | Fill #0 | Status: AC

## 2021-05-04 ENCOUNTER — Other Ambulatory Visit (HOSPITAL_COMMUNITY): Payer: Self-pay

## 2021-05-04 ENCOUNTER — Ambulatory Visit: Payer: 59 | Admitting: Family Medicine

## 2021-05-04 ENCOUNTER — Other Ambulatory Visit: Payer: Self-pay

## 2021-05-04 VITALS — BP 149/100 | HR 98 | Temp 98.3°F | Ht 71.0 in | Wt 207.2 lb

## 2021-05-04 DIAGNOSIS — J302 Other seasonal allergic rhinitis: Secondary | ICD-10-CM | POA: Diagnosis not present

## 2021-05-04 DIAGNOSIS — I10 Essential (primary) hypertension: Secondary | ICD-10-CM

## 2021-05-04 DIAGNOSIS — F909 Attention-deficit hyperactivity disorder, unspecified type: Secondary | ICD-10-CM | POA: Diagnosis not present

## 2021-05-04 MED ORDER — AZELASTINE HCL 0.1 % NA SOLN
2.0000 | Freq: Two times a day (BID) | NASAL | 12 refills | Status: DC
Start: 2021-05-04 — End: 2023-10-10
  Filled 2021-05-04: qty 30, 25d supply, fill #0
  Filled 2022-01-24: qty 30, 25d supply, fill #1

## 2021-05-04 MED ORDER — LISDEXAMFETAMINE DIMESYLATE 20 MG PO CAPS
20.0000 mg | ORAL_CAPSULE | Freq: Every day | ORAL | 0 refills | Status: DC
  Filled 2021-05-04: qty 30, 30d supply, fill #0

## 2021-05-04 NOTE — Progress Notes (Signed)
   Jason [name] is a 40 y.o. male who presents today for an office visit.  Assessment/Plan:  Chronic Problems Addressed Today: ADHD Patient has been not been on medications for several years.  He was diagnosed with ADHD while taking school and did well with Vyvanse.  Had minimal side effects that time.  Symptoms have been worsening due to increased patient load.  We will restart low-dose Vyvanse.  Discussed potential side effects.  We will follow-up with me again in a few weeks for CPE.  Database without red flags.  Seasonal allergies Not controlled.  Has not been able to tolerate Flonase.  Start Astelin nasal spray.  Essential hypertension Above goal today.  Typically well controlled at home.  Continue home monitoring especially since we are starting Vyvanse.  We will follow-up again in a few weeks for his CPE.     Subjective:  HPI:  See A/p.         Objective:  Physical Exam: BP (!) 149/100   Pulse 98   Temp 98.3 F (36.8 C)   Ht 5\' 11"  (1.803 m)   Wt 207 lb 4 oz (94 kg)   SpO2 97%   BMI 28.91 kg/m    Wt Readings from Last 3 Encounters:  05/04/21 207 lb 4 oz (94 kg)  05/16/20 199 lb 3.2 oz (90.4 kg)   Gen: No acute distress, resting comfortablylar rate and rhythm with no murmurs appreciated Pulm: Normal work of breathing, clear to auscultation bilaterally with no crackles, wheezes, or rhonchi Neuro: Grossly normal, moves all extremities Psych: Normal affect and thought content      Aishah Teffeteller M. 05/18/20, MD 05/04/2021 9:48 AM

## 2021-05-04 NOTE — Assessment & Plan Note (Signed)
Patient has been not been on medications for several years.  He was diagnosed with ADHD while taking school and did well with Vyvanse.  Had minimal side effects that time.  Symptoms have been worsening due to increased patient load.  We will restart low-dose Vyvanse.  Discussed potential side effects.  We will follow-up with me again in a few weeks for CPE.  Database without red flags.

## 2021-05-04 NOTE — Assessment & Plan Note (Signed)
Above goal today.  Typically well controlled at home.  Continue home monitoring especially since we are starting Vyvanse.  We will follow-up again in a few weeks for his CPE.

## 2021-05-04 NOTE — Patient Instructions (Signed)
It was very nice to see you today!  We will restart your Vyvanse.  I will send in the Astelin for your postnasal drip.  See back in a few weeks for your annual checkup.  Please come back to see [place] sooner if needed.  Take care, Dr Jimmey Ralph  PLEASE NOTE:  If you had any lab tests please let us know if you have not heard back within a few days. You may see your results on mychart before we have a chance to review them but we will give you a call once they are reviewed by [place]. If we ordered any referrals today, please let us know if you have not heard from their office within the next week.   Please try these tips to maintain a healthy lifestyle:   Eat at least 3 REAL meals and 1-2 snacks per day.  Aim for no more than 5 hours between eating.  If you eat breakfast, please do so within one hour of getting up.    Each meal should contain half fruits/vegetables, one quarter protein, and one quarter carbs (no bigger than a computer mouse)   Cut down on sweet beverages. This includes juice, soda, and sweet tea.     Drink at least 1 glass of water with each meal and aim for at least 8 glasses per day   Exercise at least 150 minutes every week.

## 2021-05-04 NOTE — Assessment & Plan Note (Signed)
Not controlled.  Has not been able to tolerate Flonase.  Start Astelin nasal spray.

## 2021-05-18 ENCOUNTER — Encounter: Payer: 59 | Admitting: Family Medicine

## 2021-05-25 ENCOUNTER — Other Ambulatory Visit (HOSPITAL_COMMUNITY): Payer: Self-pay

## 2021-05-25 ENCOUNTER — Ambulatory Visit (INDEPENDENT_AMBULATORY_CARE_PROVIDER_SITE_OTHER): Payer: 59 | Admitting: Family Medicine

## 2021-05-25 ENCOUNTER — Other Ambulatory Visit: Payer: Self-pay

## 2021-05-25 ENCOUNTER — Encounter: Payer: Self-pay | Admitting: Family Medicine

## 2021-05-25 VITALS — BP 130/88 | HR 86 | Temp 97.8°F | Wt 203.6 lb

## 2021-05-25 DIAGNOSIS — M549 Dorsalgia, unspecified: Secondary | ICD-10-CM | POA: Diagnosis not present

## 2021-05-25 DIAGNOSIS — Z6828 Body mass index (BMI) 28.0-28.9, adult: Secondary | ICD-10-CM

## 2021-05-25 DIAGNOSIS — F909 Attention-deficit hyperactivity disorder, unspecified type: Secondary | ICD-10-CM | POA: Diagnosis not present

## 2021-05-25 DIAGNOSIS — I10 Essential (primary) hypertension: Secondary | ICD-10-CM

## 2021-05-25 DIAGNOSIS — E663 Overweight: Secondary | ICD-10-CM

## 2021-05-25 DIAGNOSIS — G8929 Other chronic pain: Secondary | ICD-10-CM | POA: Insufficient documentation

## 2021-05-25 DIAGNOSIS — J302 Other seasonal allergic rhinitis: Secondary | ICD-10-CM

## 2021-05-25 DIAGNOSIS — Z0001 Encounter for general adult medical examination with abnormal findings: Secondary | ICD-10-CM | POA: Diagnosis not present

## 2021-05-25 MED ORDER — LISDEXAMFETAMINE DIMESYLATE 30 MG PO CAPS
30.0000 mg | ORAL_CAPSULE | Freq: Every day | ORAL | 0 refills | Status: DC
  Filled 2021-05-25 (×2): qty 30, 30d supply, fill #0

## 2021-05-25 NOTE — Patient Instructions (Signed)
It was very nice to see you today!  We can increase your Vyvanse to 30 mg daily.  I will place a referral for you to see sports medicine.  Please continue to work on diet and exercise.  We will see back in year for your annual physical.  Please check in with me in a few weeks let me know how the Vyvanse is working for you.  Take care, Dr Jimmey Ralph  PLEASE NOTE:  If you had any lab tests please let us know if you have not heard back within a few days. You may see your results on mychart before we have a chance to review them but we will give you a call once they are reviewed by [place]. If we ordered any referrals today, please let us know if you have not heard from their office within the next week.   Please try these tips to maintain a healthy lifestyle:  Eat at least 3 REAL meals and 1-2 snacks per day.  Aim for no more than 5 hours between eating.  If you eat breakfast, please do so within one hour of getting up.   Each meal should contain half fruits/vegetables, one quarter protein, and one quarter carbs (no bigger than a computer mouse)  Cut down on sweet beverages. This includes juice, soda, and sweet tea.   Drink at least 1 glass of water with each meal and aim for at least 8 glasses per day  Exercise at least 150 minutes every week.   Preventive Care 38-38 Years Old, Male Preventive care refers to lifestyle choices and visits with your health care provider that can promote health and wellness. This includes: A yearly physical exam. This is also called an annual wellness visit. Regular dental and eye exams. Immunizations. Screening for certain conditions. Healthy lifestyle choices, such as: Eating a healthy diet. Getting regular exercise. Not using drugs or products that contain nicotine and tobacco. Limiting alcohol use. What can I expect for my preventive care visit? Physical exam Your health care provider may check your: Height and weight. These may be used to calculate your  BMI (body mass index). BMI is a measurement that tells if you are at a healthy weight. Heart rate and blood pressure. Body temperature. Skin for abnormal spots. Counseling Your health care provider may ask you questions about your: Past medical problems. Family's medical history. Alcohol, tobacco, and drug use. Emotional well-being. Home life and relationship well-being. Sexual activity. Diet, exercise, and sleep habits. Work and work Astronomer. Access to firearms. What immunizations do I need?  Vaccines are usually given at various ages, according to a schedule. Your health care provider will recommend vaccines for you based on your age, medicalhistory, and lifestyle or other factors, such as travel or where you work. What tests do I need? Blood tests Lipid and cholesterol levels. These may be checked every 5 years starting at age 42. Hepatitis C test. Hepatitis B test. Screening  Diabetes screening. This is done by checking your blood sugar (glucose) after you have not eaten for a while (fasting). Genital exam to check for testicular cancer or hernias. STD (sexually transmitted disease) testing, if you are at risk. Talk with your health care provider about your test results, treatment options,and if necessary, the need for more tests. Follow these instructions at home: Eating and drinking  Eat a healthy diet that includes fresh fruits and vegetables, whole grains, lean protein, and low-fat dairy products. Drink enough fluid to keep your urine  pale yellow. Take vitamin and mineral supplements as recommended by your health care provider. Do not drink alcohol if your health care provider tells you not to drink. If you drink alcohol: Limit how much you have to 0-2 drinks a day. Be aware of how much alcohol is in your drink. In the [place] one drink equals one 12 oz bottle of beer (355 mL), one 5 oz glass of wine (148 mL), or one 1 oz glass of hard liquor (44  mL).  Lifestyle Take daily care of your teeth and gums. Brush your teeth every morning and night with fluoride toothpaste. Floss one time each day. Stay active. Exercise for at least 30 minutes 5 or more days each week. Do not use any products that contain nicotine or tobacco, such as cigarettes, e-cigarettes, and chewing tobacco. If you need help quitting, ask your health care provider. Do not use drugs. If you are sexually active, practice safe sex. Use a condom or other form of protection to prevent STIs (sexually transmitted infections). Find healthy ways to cope with stress, such as: Meditation, yoga, or listening to music. Journaling. Talking to a trusted person. Spending time with friends and family. Safety Always wear your seat belt while driving or riding in a vehicle. Do not drive: If you have been drinking alcohol. Do not ride with someone who has been drinking. When you are tired or distracted. While texting. Wear a helmet and other protective equipment during sports activities. If you have firearms in your house, make sure you follow all gun safety procedures. Seek help if you have been physically or sexually abused. What's next? Go to your health care provider once a year for an annual wellness visit. Ask your health care provider how often you should have your eyes and teeth checked. Stay up to date on all vaccines. This information is not intended to replace advice given to you by your health care provider. Make sure you discuss any questions you have with your healthcare provider. Document Revised: 08/04/2019 Document Reviewed: 11/12/2018 Elsevier Patient Education  2022 [employer].

## 2021-05-25 NOTE — Assessment & Plan Note (Signed)
At goal on losartan 50 mg daily. 

## 2021-05-25 NOTE — Progress Notes (Signed)
Chief Complaint:  Jason [name] is a 40 y.o. male who presents today for his annual comprehensive physical exam.    Assessment/Plan:  Chronic Problems Addressed Today: Chronic back pain Several year history.  Has seen massage therapy chiropractors in the past.  Injured it while a teenager playing backyard football.  We will place referral to sports medicine per request.  Essential hypertension At goal on losartan 50 mg daily.  ADHD Doing well with Vyvanse though feels like he could be a little bit stronger.  Will increase dose from 20 to 30 mg and he will check in with me in about a month or so.  Will need med recheck in 3 to 6 months.  Seasonal allergies Astelin caused excessive dryness.  He has been using as needed.  Recommend to use nasal saline to help prevent dryness.  Hemorrhoids Symptoms have improved.  Recommended dietary fiber supplementation such as Metamucil or Benefiber.  Body mass index is 28.4 kg/m. [name]  BMI Metric Follow Up - 05/25/21 1329       BMI Metric Follow Up-Please document annually   BMI Metric Follow Up Education provided              Preventative Healthcare: Up-to-date on screenings.  Patient Counseling(The following topics were reviewed and/or handout was given):  -Nutrition: Stressed importance of moderation in sodium/caffeine intake, saturated fat and cholesterol, caloric balance, sufficient intake of fresh fruits, vegetables, and fiber.  -Stressed the importance of regular exercise.   -Substance Abuse: Discussed cessation/primary prevention of tobacco, alcohol, or other drug use; driving or other dangerous activities under the influence; availability of treatment for abuse.   -Injury prevention: Discussed safety belts, safety helmets, smoke detector, smoking near bedding or upholstery.   -Sexuality: Discussed sexually transmitted diseases, partner selection, use of condoms, avoidance of unintended pregnancy and contraceptive  alternatives.   -Dental health: Discussed importance of regular tooth brushing, flossing, and dental visits.  -Health maintenance and immunizations reviewed. Please refer to Health maintenance section.  Return to care in 1 year for next preventative visit.     Subjective:  HPI:  He  notes that the Vyvanse is effective; produces relaxation, increased focus and is able to control over-eating better. He expresses interest in a higher dose. The nasal spray he is using is effective, but dries out his nostrils and eyes. Because of this he has not used it for the past two days. Dryness produced by the nasal spray is inconsistent, as it does not always occur under the same conditions.   Additionally, he has had chronic back problems which he sees a Land and a masseuse for. The chiropractor states that is spine is "out of alignment". He denies radiating pain. Sitting in saddle chairs for extended periods of time causes muscle spasms. He admits to currently having muscle tightness under his right scapula.   He had a hemorrhoid last week which has been lingering for longer than previous hemorrhoids which has improved, but he is unsure if it is still present. He admits to bleeding when the hemorrhoid first appeared, but has since tapered off.   Lifestyle Diet: Diet is reasonably healthy.  Exercise: Uses VR for exercises.  Depression screen [facility] 2/9 05/25/2021  Decreased Interest 0  Down, Depressed, Hopeless 0  PHQ - 2 Score 0  Altered sleeping 0  Tired, decreased energy 0  Change in appetite 0  Feeling bad or failure about yourself  0  Trouble concentrating 0  Moving slowly or  fidgety/restless 0  Suicidal thoughts 0  PHQ-9 Score 0    Health Maintenance Due  Topic Date Due   HIV Screening  Never done   Hepatitis C Screening  Never done     ROS: Per HPI, otherwise a complete review of systems was negative.   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 30 mL 12   losartan (COZAAR) 50 MG tablet TAKE 1 TABLET (50 MG TOTAL) BY MOUTH DAILY. 90 tablet 3   Melatonin 5 MG CAPS Take by mouth as needed.     Multiple Vitamin (MULTIVITAMIN ADULT PO) Take by mouth.     lisdexamfetamine (VYVANSE) 30 MG capsule Take 1 capsule (30 mg total) by mouth daily. 30 capsule 0   No current facility-administered medications for this visit.    Allergies-reviewed and updated Allergies  Allergen Reactions   Penicillins     Social History   Socioeconomic History   Marital status: Married    Spouse name: Not on file   Number of children: Not on file   Years of education: Not on file   Highest education level: Not on file  Occupational History   Occupation: Dentist  Tobacco Use   Smoking status: Never   Smokeless tobacco: Never  Vaping Use   Vaping Use: Never used  Substance and Sexual Activity   Alcohol use: Yes   Drug use: Not on file   Sexual activity: Not on file  Other Topics Concern   Not on file  Social History Narrative   Not on file   Social Determinants of Health   Financial Resource Strain: Not on file   Food Insecurity: Not on file  Transportation Needs: Not on file  Physical Activity: Not on file  Stress: Not on file  Social Connections: Not on file        Objective:  Physical Exam: BP 130/88   Pulse 86   Temp 97.8 F (36.6 C) (Temporal)   Wt 203 lb 9.6 oz (92.4 kg)   SpO2 97%   BMI 28.40 kg/m   Body mass index is 28.4 kg/m. Wt Readings from Last 3 Encounters:  05/25/21 203 lb 9.6 oz (92.4 kg)  05/04/21 207 lb 4 oz (94 kg)  05/16/20 199 lb 3.2 oz (90.4 kg)   Gen: NAD, resting comfortably HEENT: TMs normal bilaterally. OP clear. No thyromegaly noted.  CV: RRR with no murmurs appreciated Pulm: NWOB, CTAB with no crackles, wheezes, or rhonchi GI: Normal bowel sounds present. Soft, Nontender, Nondistended. MSK: no edema, cyanosis, or clubbing  noted Skin: warm, dry Neuro: CN2-12 grossly intact. Strength 5/5 in upper and lower extremities. Reflexes symmetric and intact bilaterally.  Psych: Normal affect and thought content     I,Jordan Kelly,acting as a scribe for Jacquiline Doe, MD.,have documented all relevant documentation on the behalf of Jacquiline Doe, MD,as directed by  Jacquiline Doe, MD while in the presence of Jacquiline Doe, MD.   I, Jacquiline Doe, MD, have reviewed all documentation for this visit. The documentation on 05/25/21 for the exam, diagnosis, procedures, and orders are all accurate and complete.   Katina Degree. Jimmey Ralph, MD 05/25/2021 1:29 PM

## 2021-05-25 NOTE — Assessment & Plan Note (Signed)
Several year history.  Has seen massage therapy chiropractors in the past.  Injured it while a teenager playing backyard football.  We will place referral to sports medicine per request.

## 2021-05-25 NOTE — Assessment & Plan Note (Signed)
Astelin caused excessive dryness.  He has been using as needed.  Recommend to use nasal saline to help prevent dryness.

## 2021-05-25 NOTE — Assessment & Plan Note (Signed)
Doing well with Vyvanse though feels like he could be a little bit stronger.  Will increase dose from 20 to 30 mg and he will check in with me in about a month or so.  Will need med recheck in 3 to 6 months.

## 2021-05-28 ENCOUNTER — Other Ambulatory Visit (HOSPITAL_COMMUNITY): Payer: Self-pay

## 2021-06-27 ENCOUNTER — Other Ambulatory Visit (HOSPITAL_COMMUNITY): Payer: Self-pay

## 2021-06-27 MED FILL — Losartan Potassium Tab 50 MG: ORAL | 90 days supply | Qty: 90 | Fill #1 | Status: AC

## 2021-06-28 ENCOUNTER — Other Ambulatory Visit (HOSPITAL_COMMUNITY): Payer: Self-pay

## 2021-06-28 MED ORDER — CARESTART COVID-19 HOME TEST VI KIT
PACK | 0 refills | Status: DC
  Filled 2021-06-28: qty 4, 4d supply, fill #0

## 2021-07-03 ENCOUNTER — Encounter: Payer: Self-pay | Admitting: Family Medicine

## 2021-07-03 NOTE — Telephone Encounter (Signed)
See note

## 2021-07-06 ENCOUNTER — Telehealth: Payer: Self-pay

## 2021-07-06 ENCOUNTER — Other Ambulatory Visit: Payer: Self-pay | Admitting: Family Medicine

## 2021-07-06 ENCOUNTER — Ambulatory Visit (INDEPENDENT_AMBULATORY_CARE_PROVIDER_SITE_OTHER): Payer: 59 | Admitting: Family Medicine

## 2021-07-06 ENCOUNTER — Other Ambulatory Visit (HOSPITAL_COMMUNITY): Payer: Self-pay

## 2021-07-06 ENCOUNTER — Ambulatory Visit (INDEPENDENT_AMBULATORY_CARE_PROVIDER_SITE_OTHER): Payer: 59

## 2021-07-06 ENCOUNTER — Other Ambulatory Visit: Payer: Self-pay

## 2021-07-06 ENCOUNTER — Encounter: Payer: Self-pay | Admitting: Family Medicine

## 2021-07-06 VITALS — BP 118/84 | HR 84 | Ht 71.0 in | Wt 198.0 lb

## 2021-07-06 DIAGNOSIS — M9901 Segmental and somatic dysfunction of cervical region: Secondary | ICD-10-CM | POA: Diagnosis not present

## 2021-07-06 DIAGNOSIS — G43809 Other migraine, not intractable, without status migrainosus: Secondary | ICD-10-CM | POA: Diagnosis not present

## 2021-07-06 DIAGNOSIS — M9902 Segmental and somatic dysfunction of thoracic region: Secondary | ICD-10-CM

## 2021-07-06 DIAGNOSIS — M9904 Segmental and somatic dysfunction of sacral region: Secondary | ICD-10-CM

## 2021-07-06 DIAGNOSIS — M545 Low back pain, unspecified: Secondary | ICD-10-CM

## 2021-07-06 DIAGNOSIS — M542 Cervicalgia: Secondary | ICD-10-CM

## 2021-07-06 DIAGNOSIS — M9908 Segmental and somatic dysfunction of rib cage: Secondary | ICD-10-CM

## 2021-07-06 DIAGNOSIS — M9903 Segmental and somatic dysfunction of lumbar region: Secondary | ICD-10-CM | POA: Diagnosis not present

## 2021-07-06 MED ORDER — TIZANIDINE HCL 2 MG PO TABS
2.0000 mg | ORAL_TABLET | Freq: Every day | ORAL | 0 refills | Status: DC
  Filled 2021-07-06: qty 30, 30d supply, fill #0

## 2021-07-06 NOTE — Progress Notes (Signed)
[place] Phone: [phone] Subjective:   Fontaine No, am serving as a scribe for Dr. Hulan Saas.  This visit occurred during the SARS-CoV-2 public health emergency.  Safety protocols were in place, including screening questions prior to the visit, additional usage of staff PPE, and extensive cleaning of exam room while observing appropriate contact time as indicated for disinfecting solutions.    I'm seeing this patient by the request  of:  Vivi Barrack, MD  CC: Neck pain and back pain  [id number]  TAITE SCHOEPPNER is a 40 y.o. male coming in with complaint of neck and lower back pain. Patient states that he has had pain for a while now. 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 Patient does suffer from tension headaches that will turn into migraines. Does see massage therapist. Also has tight psoas and hip flexors which he knows is contributing to his LBP.  Denies any radiation.      Past Medical History:  Diagnosis Date   Allergy    Hypertension    Migraine    Past Surgical History:  Procedure Laterality Date   ADENOIDECTOMY     SALIVARY GLAND SURGERY     TONSILLECTOMY     WISDOM TOOTH EXTRACTION     Social History   Socioeconomic History   Marital status: Married    Spouse name: Not on file   Number of children: Not on file   Years of education: Not on file   Highest education level: Not on file  Occupational History   Occupation: Dentist  Tobacco Use   Smoking status: Never   Smokeless tobacco: Never  Vaping Use   Vaping Use: Never used  Substance and Sexual Activity   Alcohol use: Yes   Drug use: Not on file   Sexual activity: Not on file  Other Topics Concern   Not on file  Social History Narrative   Not on file   Social Determinants of Health   Financial Resource Strain: Not on file  Food Insecurity: Not on file  Transportation  Needs: Not on file  Physical Activity: Not on file  Stress: Not on file  Social Connections: Not on file   Allergies  Allergen Reactions   Penicillins    Family History  Problem Relation Age of Onset   Skin cancer Father    Stroke Paternal Grandmother    Stroke Paternal Grandfather    Parkinson's disease Paternal Grandfather      Current Outpatient Medications (Cardiovascular):    losartan (COZAAR) 50 MG tablet, TAKE 1 TABLET (50 MG TOTAL) BY MOUTH DAILY.  Current Outpatient Medications (Respiratory):    azelastine (ASTELIN) 0.1 % nasal spray, Place 2 sprays into both nostrils 2 (two) times daily.    Current Outpatient Medications (Other):    COVID-19 At Home Antigen Test [facility] COVID-19 HOME TEST) KIT, Use as directed.   lisdexamfetamine (VYVANSE) 30 MG capsule, Take 1 capsule (30 mg total) by mouth daily.   Multiple Vitamin (MULTIVITAMIN ADULT PO), Take by mouth.   tiZANidine (ZANAFLEX) 2 MG tablet, Take 1 tablet (2 mg total) by mouth at bedtime.   Melatonin 5 MG CAPS, Take by mouth as needed.   Reviewed prior external information including notes and imaging from  primary care provider As well as notes that were available from care everywhere and other healthcare systems.  Past medical history, social,  surgical and family history all reviewed in electronic medical record.  No pertanent information unless stated regarding to the chief complaint.   Review of Systems:  No  visual changes, nausea, vomiting, diarrhea, constipation, dizziness, abdominal pain, skin rash, fevers, chills, night sweats, weight loss, swollen lymph nodes, body aches, joint swelling, chest pain, shortness of breath, mood changes. POSITIVE muscle aches, headaches  Objective  Blood pressure 118/84, pulse 84, height _0  (1.803 m), weight 198 lb (89.8 kg), SpO2 98 %.   General: No apparent distress alert and oriented x3 mood and affect normal, dressed appropriately.  HEENT: Pupils equal,  extraocular movements intact  Respiratory: Patient's speak in full sentences and does not appear short of breath  Cardiovascular: No lower extremity edema, non tender, no erythema  Gait normal with good balance and coordination.  MSK:  Non tender with full range of motion and good stability and symmetric strength and tone of , elbows, wrist, hip, knee and ankles bilaterally.  Neck exam does show significant tightness noted in flexion and extension lacking the last 10 degrees in both legs.  Patient has limitation of 5 degrees of rotation and sidebending bilaterally as well.  Significant tightness of the musculature at the occipital area.  Patient does have scapular dyskinesis noted on the left side.  Mild weakness noted with inferior winging. Low back exam does have tightness noted to be hip flexor bilaterally left greater than left.Mild tenderness in the thoracolumbar juncture.  Osteopathic findings C2 flexed rotated right C5 flexed rotated and side bent left T4 extended rotated and side bent right with inhaled rib T9 extended rotated and side bent left L1 flexed rotated and side bent right Sacrum right on right Impression and Recommendations:     The above documentation has been reviewed and is accurate and complete Lyndal Pulley, DO

## 2021-07-06 NOTE — Patient Instructions (Addendum)
Xray today Exercises 3x a week Tart cherry extract 1200mg  at night Zanaflex 2mg  at night See me in 4-6 weeks

## 2021-07-06 NOTE — Assessment & Plan Note (Signed)

## 2021-07-06 NOTE — Telephone Encounter (Signed)
MEDICATION: lisdexamfetamine (VYVANSE) 30 MG capsule  PHARMACY:  [facility] Phone:  [phone]  Fax:  [phone]      Comments:   **Let patient know to contact pharmacy at the end of the day to make sure medication is ready. **  ** Please notify patient to allow 48-72 hours to process**  **Encourage patient to contact the pharmacy for refills or they can request refills through [facility]**

## 2021-07-06 NOTE — Assessment & Plan Note (Signed)
Cervicogenic headaches.  Patient has had these for quite some time.  Do think that they are positional.  Getting most relaxer.  Responded well to manipulation.  Work with Event organiser to learn scapular stability techniques.  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  Follow-up again with me in 6 weeks

## 2021-07-07 MED ORDER — LISDEXAMFETAMINE DIMESYLATE 30 MG PO CAPS
30.0000 mg | ORAL_CAPSULE | Freq: Every day | ORAL | 0 refills | Status: DC
  Filled 2021-07-07: qty 30, 30d supply, fill #0

## 2021-07-09 ENCOUNTER — Encounter: Payer: Self-pay | Admitting: Family Medicine

## 2021-07-09 ENCOUNTER — Other Ambulatory Visit (HOSPITAL_COMMUNITY): Payer: Self-pay

## 2021-07-20 ENCOUNTER — Ambulatory Visit: Payer: 59 | Admitting: Family Medicine

## 2021-07-22 ENCOUNTER — Encounter: Payer: Self-pay | Admitting: Family Medicine

## 2021-08-02 NOTE — Progress Notes (Signed)
Jason [name] [address] [place] [address] Phone: [phone] Subjective:   INadine Counts, am serving as a scribe for Dr. Antoine Primas. This visit occurred during the SARS-CoV-2 public health emergency.  Safety protocols were in place, including screening questions prior to the visit, additional usage of staff PPE, and extensive cleaning of exam room while observing appropriate contact time as indicated for disinfecting solutions.   I'm seeing this patient by the request  of:  [name], MD  CC: Right shoulder pain back pain follow-up  [id number]  Jason [name] is a 40 y.o. male coming in with complaint of back and neck pain. OMT 07/06/2021. Patient did sent MyChart message stating that he had increase in thoracic spine pain since last visit. Patient offered prednisone but he declined. Patient states when he does scapular exercises he feels a tension or block on his right side, especially when he does the arm extensions. The pain is worse in the morning and affects his neck.  Medications patient has been prescribed: Zanaflex  Taking:         Reviewed prior external information including notes and imaging from previsou exam, outside providers and external EMR if available.   As well as notes that were available from care everywhere and other healthcare systems.  Past medical history, social, surgical and family history all reviewed in electronic medical record.  No pertanent information unless stated regarding to the chief complaint.   Past Medical History:  Diagnosis Date   Allergy    Hypertension    Migraine     Allergies  Allergen Reactions   Penicillins      Review of Systems:  No headache, visual changes, nausea, vomiting, diarrhea, constipation, dizziness, abdominal pain, skin rash, fevers, chills, night sweats, weight loss, swollen lymph nodes, body aches, joint swelling, chest pain, shortness of breath, mood  changes. POSITIVE muscle aches  Objective  Blood pressure 128/82, pulse 89, height 5\' 11"  (1.803 m), weight 197 lb (89.4 kg), SpO2 99 %.   General: No apparent distress alert and oriented x3 mood and affect normal, dressed appropriately.  HEENT: Pupils equal, extraocular movements intact  Respiratory: Patient's speak in full sentences and does not appear short of breath  Cardiovascular: No lower extremity edema, non tender, no erythema  Patient does have significant tightness noted of the trapezius area on the right side.  Patient has multiple trigger points going from the cervical thoracic area and along the medial border of the scapula for all the way to T7.  After verbal consent patient was prepped with alcohol swabs and with a 25-gauge half inch needle injected into 4 distinct trigger points in the 3 different muscle.  This included the levator scapula, rhomboid, trapezius.  Total of 3 cc 0.5% Marcaine and 1 cc of Kenalog 40 mg/mL used.  Minimal blood loss.  Band-Aid placed.  Postinjection instructions given       Assessment and Plan:  Trigger point of right shoulder region Patient did have trigger point injections given today.::  1.  Patient did respond well to this.  Discussed with patient to continue the exercises of the icing regimen as well as continuing with the posture.  Increase activity slowly.  Patient could be a candidate for potential formal physical therapy and dry needling if needed.  Continue with massage therapy.  Given recommendation of pillows.  Follow-up with me again 4 to 6 weeks  The above documentation has been reviewed and is accurate and complete Lyndal Pulley, DO        Note: This dictation was prepared with Dragon dictation along with smaller phrase technology. Any transcriptional errors that result from this process are unintentional.

## 2021-08-03 ENCOUNTER — Other Ambulatory Visit: Payer: Self-pay

## 2021-08-03 ENCOUNTER — Ambulatory Visit (INDEPENDENT_AMBULATORY_CARE_PROVIDER_SITE_OTHER): Payer: 59 | Admitting: Family Medicine

## 2021-08-03 ENCOUNTER — Encounter: Payer: Self-pay | Admitting: Family Medicine

## 2021-08-03 DIAGNOSIS — M25511 Pain in right shoulder: Secondary | ICD-10-CM

## 2021-08-03 NOTE — Assessment & Plan Note (Signed)
Patient did have trigger point injections given today.::  1.  Patient did respond well to this.  Discussed with patient to continue the exercises of the icing regimen as well as continuing with the posture.  Increase activity slowly.  Patient could be a candidate for potential formal physical therapy and dry needling if needed.  Continue with massage therapy.  Given recommendation of pillows.  Follow-up with me again 4 to 6 weeks

## 2021-08-03 NOTE — Patient Instructions (Addendum)
Coop Pillow Good to see you Wait until Sunday until you test it out Stay hydrated Consider dry needling See you again in 4-6 weeks

## 2021-08-15 ENCOUNTER — Other Ambulatory Visit: Payer: Self-pay | Admitting: Family Medicine

## 2021-08-15 ENCOUNTER — Other Ambulatory Visit (HOSPITAL_COMMUNITY): Payer: Self-pay

## 2021-08-15 MED ORDER — LISDEXAMFETAMINE DIMESYLATE 30 MG PO CAPS
30.0000 mg | ORAL_CAPSULE | Freq: Every day | ORAL | 0 refills | Status: DC
  Filled 2021-08-15: qty 30, 30d supply, fill #0

## 2021-09-06 NOTE — Progress Notes (Signed)
[name] [address] [place] [address] Phone: [phone] Subjective:   INadine Counts, am serving as a scribe for Dr. Antoine Primas. This visit occurred during the SARS-CoV-2 public health emergency.  Safety protocols were in place, including screening questions prior to the visit, additional usage of staff PPE, and extensive cleaning of exam room while observing appropriate contact time as indicated for disinfecting solutions.   I'm seeing this patient by the request  of:  [name], MD  CC: Back and neck pain follow-up  [id number]  Jason [name] is a 40 y.o. male coming in with complaint of back and neck pain. OMT 07/06/2021. Trigger point injection R shoulder 08/03/2021. Patient states injections definitely helped. Neck pain still consistent. Right trap tighter and in more persistent pain than left.  Medications patient has been prescribed: Zanaflex  Taking:  Xray cervical 07/06/2021 IMPRESSION: No acute cervical spine fracture or traumatic listhesis.   Chronic reversal the cervical lordosis centered at C6.   Chronic wedging C5, C6 with focal advanced cervical spondylitic changes at these levels as well.  Xray lumbar 07/06/2021 IMPRESSION: No Acute or worrisome osseous abnormalities. Minimal discogenic changes L4-5, L5-S1.           Past Medical History:  Diagnosis Date   Allergy    Hypertension    Migraine     Allergies  Allergen Reactions   Penicillins      Review of Systems:  No headache, visual changes, nausea, vomiting, diarrhea, constipation, dizziness, abdominal pain, skin rash, fevers, chills, night sweats, weight loss, swollen lymph nodes, body aches, joint swelling, chest pain, shortness of breath, mood changes. POSITIVE muscle aches  Objective  Blood pressure 120/86, pulse 76, height 5\' 11"  (1.803 m), weight 198 lb (89.8 kg), SpO2 97 %.   General: No apparent distress alert and oriented x3 mood  and affect normal, dressed appropriately.  HEENT: Pupils equal, extraocular movements intact  Respiratory: Patient's speak in full sentences and does not appear short of breath  Cardiovascular: No lower extremity edema, non tender, no erythema  Patient's neck exam does still have some mild loss of lordosis.  Tightness noted on the paraspinal musculature right greater than left.  Negative Spurling's.  Tightness noted in the right parascapular region still noted.    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  Could consider the possibility of advanced imaging but patient is doing relatively well.  Chronic problem with mild exacerbation but is responding to manipulation and did do very well with the trigger point injections.  Has Zanaflex for breakthrough.  Follow-up again in 6 to 8 weeks.   Nonallopathic problems  Decision today to treat with OMT was based on Physical Exam  After verbal consent patient was treated with HVLA, ME, FPR techniques in cervical, rib, thoracic, lumbar, and sacral  areas  Patient tolerated the procedure well with improvement in symptoms  Patient given exercises, stretches and lifestyle modifications  See medications in patient instructions if given  Patient will follow up in 4-8 weeks      The above documentation has been reviewed and is accurate and complete , DO  Note: This dictation was prepared with Dragon dictation along with smaller phrase technology. Any transcriptional errors that result from this process are unintentional.

## 2021-09-07 ENCOUNTER — Other Ambulatory Visit: Payer: Self-pay

## 2021-09-07 ENCOUNTER — Ambulatory Visit (INDEPENDENT_AMBULATORY_CARE_PROVIDER_SITE_OTHER): Payer: 59 | Admitting: Family Medicine

## 2021-09-07 ENCOUNTER — Encounter: Payer: Self-pay | Admitting: Family Medicine

## 2021-09-07 VITALS — BP 120/86 | HR 76 | Ht 71.0 in | Wt 198.0 lb

## 2021-09-07 DIAGNOSIS — M9901 Segmental and somatic dysfunction of cervical region: Secondary | ICD-10-CM | POA: Diagnosis not present

## 2021-09-07 DIAGNOSIS — M9904 Segmental and somatic dysfunction of sacral region: Secondary | ICD-10-CM | POA: Diagnosis not present

## 2021-09-07 DIAGNOSIS — M9902 Segmental and somatic dysfunction of thoracic region: Secondary | ICD-10-CM | POA: Diagnosis not present

## 2021-09-07 DIAGNOSIS — M9903 Segmental and somatic dysfunction of lumbar region: Secondary | ICD-10-CM

## 2021-09-07 DIAGNOSIS — G43809 Other migraine, not intractable, without status migrainosus: Secondary | ICD-10-CM

## 2021-09-07 DIAGNOSIS — M9908 Segmental and somatic dysfunction of rib cage: Secondary | ICD-10-CM

## 2021-09-07 NOTE — Assessment & Plan Note (Signed)
Patient does have some underlying arthritic changes of the neck that we may need to continue to monitor.  Could consider the possibility of advanced imaging but patient is doing relatively well.  Chronic problem with mild exacerbation but is responding to manipulation and did do very well with the trigger point injections.  Has Zanaflex for breakthrough.  Follow-up again in 6 to 8 weeks.

## 2021-09-07 NOTE — Patient Instructions (Signed)
Use massage gun  See me in 6-8 weeks

## 2021-09-14 ENCOUNTER — Ambulatory Visit: Payer: 59 | Admitting: Family Medicine

## 2021-09-14 ENCOUNTER — Encounter: Payer: Self-pay | Admitting: Family Medicine

## 2021-09-14 ENCOUNTER — Other Ambulatory Visit: Payer: Self-pay

## 2021-09-14 ENCOUNTER — Other Ambulatory Visit (HOSPITAL_COMMUNITY): Payer: Self-pay

## 2021-09-14 VITALS — BP 124/68 | HR 89 | Temp 97.6°F | Wt 196.6 lb

## 2021-09-14 DIAGNOSIS — K649 Unspecified hemorrhoids: Secondary | ICD-10-CM | POA: Diagnosis not present

## 2021-09-14 DIAGNOSIS — I10 Essential (primary) hypertension: Secondary | ICD-10-CM

## 2021-09-14 DIAGNOSIS — F909 Attention-deficit hyperactivity disorder, unspecified type: Secondary | ICD-10-CM

## 2021-09-14 MED ORDER — LISDEXAMFETAMINE DIMESYLATE 30 MG PO CAPS
30.0000 mg | ORAL_CAPSULE | Freq: Every day | ORAL | 0 refills | Status: DC
  Filled 2021-10-26: qty 30, 30d supply, fill #0

## 2021-09-14 MED ORDER — LISDEXAMFETAMINE DIMESYLATE 30 MG PO CAPS
30.0000 mg | ORAL_CAPSULE | Freq: Every day | ORAL | 0 refills | Status: DC
  Filled 2021-09-14: qty 30, 30d supply, fill #0

## 2021-09-14 MED ORDER — LISDEXAMFETAMINE DIMESYLATE 30 MG PO CAPS
30.0000 mg | ORAL_CAPSULE | Freq: Every day | ORAL | 0 refills | Status: DC
  Filled 2022-01-01 (×2): qty 30, 30d supply, fill #0

## 2021-09-14 NOTE — Assessment & Plan Note (Signed)
Discussed conservative measures.  Current flare seems to be resolving.  We discussed the importance of fiber supplementation.  Can also try stool softener such as Colace or small dose of MiraLAX.  He will let me know if he has a recurrent flare and we can send in proctocort or something similar.  He does not wish to have surgery at this point.

## 2021-09-14 NOTE — Progress Notes (Signed)
   Jason [name] is a 40 y.o. male who presents today for an office visit.  Assessment/Plan:  Chronic Problems Addressed Today: Hemorrhoid Discussed conservative measures.  Current flare seems to be resolving.  We discussed the importance of fiber supplementation.  Can also try stool softener such as Colace or small dose of MiraLAX.  He will let me know if he has a recurrent flare and we can send in proctocort or something similar.  He does not wish to have surgery at this point.  ADHD Doing well on Vyvanse 30 mg daily.  We will continue current dose.  Follow-up in 3 to 6 months.  Medications help with ability to stay focused and on task.  Essential hypertension At goal.  He will decrease losartan to 25 mg daily in a couple of weeks.  He will continue monitoring at home and check in with me.  We can wean off losartan as tolerated.     Subjective:  HPI:  See A/P for status of chronic conditions.  Patient has had longstanding history of hemorrhoids.  Most recently had episode about 3 weeks ago.  Used topical Preparation H which seemed to help.  Usually has a protruding mass that can become irritated and painful.  Occasionally has an internal hemorrhoid that bleeds.  He has been trying over-the-counter fiber supplement but is not sure if it is helping.  Symptoms have improved over the last couple of days.       Objective:  Physical Exam: BP 124/68   Pulse 89   Temp 97.6 F (36.4 C)   Wt 196 lb 9.6 oz (89.2 kg)   SpO2 99%   BMI 27.42 kg/m   Gen: No acute distress, resting comfortably CV: Regular rate and rhythm with no murmurs appreciated Pulm: Normal work of breathing, clear to auscultation bilaterally with no crackles, wheezes, or rhonchi GU: Small left-sided external hemorrhoid noted. Neuro: Grossly normal, moves all extremities Psych: Normal affect and thought content      Sallie Staron M. Jimmey Ralph, MD 09/14/2021 3:46 PM

## 2021-09-14 NOTE — Assessment & Plan Note (Signed)
At goal.  He will decrease losartan to 25 mg daily in a couple of weeks.  He will continue monitoring at home and check in with me.  We can wean off losartan as tolerated.

## 2021-09-14 NOTE — Assessment & Plan Note (Signed)
Doing well on Vyvanse 30 mg daily.  We will continue current dose.  Follow-up in 3 to 6 months.  Medications help with ability to stay focused and on task.

## 2021-09-14 NOTE — Patient Instructions (Signed)
It was very nice to see you today!  Please let mw know if the hemorrhoids come back.  You can try using a small dose of the MiraLAX to see if this helps.  I will refill 3 separate prescriptions for your Vyvanse.  You can cut your dose of losartan in half.  Please send a message in a few weeks to let me know how this is doing.  Take care, Dr Jimmey Ralph  PLEASE NOTE:  If you had any lab tests please let us know if you have not heard back within a few days. You may see your results on mychart before we have a chance to review them but we will give you a call once they are reviewed by [place]. If we ordered any referrals today, please let us know if you have not heard from their office within the next week.   Please try these tips to maintain a healthy lifestyle:  Eat at least 3 REAL meals and 1-2 snacks per day.  Aim for no more than 5 hours between eating.  If you eat breakfast, please do so within one hour of getting up.   Each meal should contain half fruits/vegetables, one quarter protein, and one quarter carbs (no bigger than a computer mouse)  Cut down on sweet beverages. This includes juice, soda, and sweet tea.   Drink at least 1 glass of water with each meal and aim for at least 8 glasses per day  Exercise at least 150 minutes every week.    Preventive Care 45-77 Years Old, Male Preventive care refers to lifestyle choices and visits with your health care provider that can promote health and wellness. This includes: A yearly physical exam. This is also called an annual wellness visit. Regular dental and eye exams. Immunizations. Screening for certain conditions. Healthy lifestyle choices, such as: Eating a healthy diet. Getting regular exercise. Not using drugs or products that contain nicotine and tobacco. Limiting alcohol use. What can I expect for my preventive care visit? Physical exam Your health care provider may check your: Height and weight. These may be used to calculate  your BMI (body mass index). BMI is a measurement that tells if you are at a healthy weight. Heart rate and blood pressure. Body temperature. Skin for abnormal spots. Counseling Your health care provider may ask you questions about your: Past medical problems. Family's medical history. Alcohol, tobacco, and drug use. Emotional well-being. Home life and relationship well-being. Sexual activity. Diet, exercise, and sleep habits. Work and work Astronomer. Access to firearms. What immunizations do I need? Vaccines are usually given at various ages, according to a schedule. Your health care provider will recommend vaccines for you based on your age, medical history, and lifestyle or other factors, such as travel or where you work. What tests do I need? Blood tests Lipid and cholesterol levels. These may be checked every 5 years starting at age 93. Hepatitis C test. Hepatitis B test. Screening  Diabetes screening. This is done by checking your blood sugar (glucose) after you have not eaten for a while (fasting). Genital exam to check for testicular cancer or hernias. STD (sexually transmitted disease) testing, if you are at risk. Talk with your health care provider about your test results, treatment options, and if necessary, the need for more tests. Follow these instructions at home: Eating and drinking  Eat a healthy diet that includes fresh fruits and vegetables, whole grains, lean protein, and low-fat dairy products. Drink enough fluid to  keep your urine pale yellow. Take vitamin and mineral supplements as recommended by your health care provider. Do not drink alcohol if your health care provider tells you not to drink. If you drink alcohol: Limit how much you have to 0-2 drinks a day. Be aware of how much alcohol is in your drink. In the [place] one drink equals one 12 oz bottle of beer (355 mL), one 5 oz glass of wine (148 mL), or one 1 oz glass of hard liquor (44  mL). Lifestyle Take daily care of your teeth and gums. Brush your teeth every morning and night with fluoride toothpaste. Floss one time each day. Stay active. Exercise for at least 30 minutes 5 or more days each week. Do not use any products that contain nicotine or tobacco, such as cigarettes, e-cigarettes, and chewing tobacco. If you need help quitting, ask your health care provider. Do not use drugs. If you are sexually active, practice safe sex. Use a condom or other form of protection to prevent STIs (sexually transmitted infections). Find healthy ways to cope with stress, such as: Meditation, yoga, or listening to music. Journaling. Talking to a trusted person. Spending time with friends and family. Safety Always wear your seat belt while driving or riding in a vehicle. Do not drive: If you have been drinking alcohol. Do not ride with someone who has been drinking. When you are tired or distracted. While texting. Wear a helmet and other protective equipment during sports activities. If you have firearms in your house, make sure you follow all gun safety procedures. Seek help if you have been physically or sexually abused. What's next? Go to your health care provider once a year for an annual wellness visit. Ask your health care provider how often you should have your eyes and teeth checked. Stay up to date on all vaccines. This information is not intended to replace advice given to you by your health care provider. Make sure you discuss any questions you have with your health care provider. Document Revised: 01/26/2021 Document Reviewed: 11/12/2018 Elsevier Patient Education  2022 [employer].

## 2021-10-05 DIAGNOSIS — H5213 Myopia, bilateral: Secondary | ICD-10-CM | POA: Diagnosis not present

## 2021-10-05 DIAGNOSIS — H52203 Unspecified astigmatism, bilateral: Secondary | ICD-10-CM | POA: Diagnosis not present

## 2021-10-22 ENCOUNTER — Other Ambulatory Visit: Payer: Self-pay | Admitting: Family Medicine

## 2021-10-22 ENCOUNTER — Other Ambulatory Visit (HOSPITAL_COMMUNITY): Payer: Self-pay

## 2021-10-22 MED ORDER — LOSARTAN POTASSIUM 50 MG PO TABS
50.0000 mg | ORAL_TABLET | Freq: Every day | ORAL | 3 refills | Status: DC
  Filled 2021-10-22: qty 90, 90d supply, fill #0
  Filled 2022-04-26: qty 90, 90d supply, fill #1

## 2021-10-25 ENCOUNTER — Other Ambulatory Visit: Payer: Self-pay | Admitting: Family Medicine

## 2021-10-26 ENCOUNTER — Other Ambulatory Visit (HOSPITAL_COMMUNITY): Payer: Self-pay

## 2021-11-01 NOTE — Progress Notes (Signed)
[name] [address] [place] [address] Phone: [phone] Subjective:   [name], am serving as a scribe for Dr. Antoine Primas.  This visit occurred during the SARS-CoV-2 public health emergency.  Safety protocols were in place, including screening questions prior to the visit, additional usage of staff PPE, and extensive cleaning of exam room while observing appropriate contact time as indicated for disinfecting solutions.   I'm seeing this patient by the request  of:  [name], MD  CC: Neck and back pain follow-up  [id number]  KRISTOFOR MICHALOWSKI is a 40 y.o. male coming in with complaint of back and neck pain. OMT on 09/07/2021. Patient states that R mastoid has been painful and L hip has been sore as well. Hip pain becomes sore in morning after sleeping. Believes that most of his upper back and cervical pain are coming from tooth excavations as he has to be in certain position with c spine laterally bent to the right and lumbar flexed and rotated L.   Medications patient has been prescribed: None  Taking:         Reviewed prior external information including notes and imaging from previsou exam, outside providers and external EMR if available.   As well as notes that were available from care everywhere and other healthcare systems.  Past medical history, social, surgical and family history all reviewed in electronic medical record.  No pertanent information unless stated regarding to the chief complaint.   Past Medical History:  Diagnosis Date   Allergy    Hypertension    Migraine     Allergies  Allergen Reactions   Penicillins      Review of Systems:  No  visual changes, nausea, vomiting, diarrhea, constipation, dizziness, abdominal pain, skin rash, fevers, chills, night sweats, weight loss, swollen lymph nodes, body aches, joint swelling, chest pain, shortness of breath, mood changes. POSITIVE muscle aches,  headache  Objective  Blood pressure 120/90, pulse 79, height 5\' 11"  (1.803 m), weight 198 lb (89.8 kg), SpO2 99 %.   General: No apparent distress alert and oriented x3 mood and affect normal, dressed appropriately.  HEENT: Pupils equal, extraocular movements intact  Respiratory: Patient's speak in full sentences and does not appear short of breath  Cardiovascular: No lower extremity edema, non tender, no erythema  Neuro: Cranial nerves II through XII are intact, neurovascularly intact in all extremities with 2+ DTRs and 2+ pulses.  Gait normal with good balance and coordination.  MSK:  Non tender with full range of motion and good stability and symmetric strength and tone of shoulders, elbows, wrist, hip, knee and ankles bilaterally.  Back -low back exam does have some loss of lordosis.  Patient does have some tightness noted around the right and left sacroiliac joint.  Tightness in the neck still noted.  Patient does have scapular dyskinesis noted right greater than left.  Osteopathic findings  C2 flexed rotated and side bent right C5 flexed rotated and side bent left T3 extended rotated and side bent right inhaled rib T8 extended rotated and side bent left L2 flexed rotated and side bent right Sacrum r left on left       Assessment and Plan:  Cervicogenic migraine Chronic, mild exacerbation, discussed HEP,   Discussed ergonomics.  Increase activity as tolerated.  We discussed potentially changing some ergonomics throughout his work environment.  Follow-up again in 6 to 8 weeks   Nonallopathic problems  Decision today  to treat with OMT was based on Physical Exam  After verbal consent patient was treated with HVLA, ME, FPR techniques in cervical, rib, thoracic, lumbar, and sacral  areas  Patient tolerated the procedure well with improvement in symptoms  Patient given exercises, stretches and lifestyle modifications  See medications in patient instructions if  given  Patient will follow up in 4-8 weeks      The above documentation has been reviewed and is accurate and complete Judi Saa, DO        Note: This dictation was prepared with Dragon dictation along with smaller phrase technology. Any transcriptional errors that result from this process are unintentional.

## 2021-11-02 ENCOUNTER — Other Ambulatory Visit: Payer: Self-pay

## 2021-11-02 ENCOUNTER — Ambulatory Visit (INDEPENDENT_AMBULATORY_CARE_PROVIDER_SITE_OTHER): Payer: 59 | Admitting: Family Medicine

## 2021-11-02 VITALS — BP 120/90 | HR 79 | Ht 71.0 in | Wt 198.0 lb

## 2021-11-02 DIAGNOSIS — M9908 Segmental and somatic dysfunction of rib cage: Secondary | ICD-10-CM

## 2021-11-02 DIAGNOSIS — M9904 Segmental and somatic dysfunction of sacral region: Secondary | ICD-10-CM

## 2021-11-02 DIAGNOSIS — M9902 Segmental and somatic dysfunction of thoracic region: Secondary | ICD-10-CM

## 2021-11-02 DIAGNOSIS — G43809 Other migraine, not intractable, without status migrainosus: Secondary | ICD-10-CM | POA: Diagnosis not present

## 2021-11-02 DIAGNOSIS — M9901 Segmental and somatic dysfunction of cervical region: Secondary | ICD-10-CM

## 2021-11-02 DIAGNOSIS — M9903 Segmental and somatic dysfunction of lumbar region: Secondary | ICD-10-CM

## 2021-11-02 NOTE — Patient Instructions (Signed)
Thanks for [email or web address] I like the idea of the loops See you again in 6-8 weeks

## 2021-11-04 ENCOUNTER — Encounter: Payer: Self-pay | Admitting: Family Medicine

## 2021-11-04 NOTE — Assessment & Plan Note (Signed)
Chronic, mild exacerbation, discussed HEP,   Discussed ergonomics.  Increase activity as tolerated.  We discussed potentially changing some ergonomics throughout his work environment.  Follow-up again in 6 to 8 weeks

## 2021-11-28 ENCOUNTER — Other Ambulatory Visit: Payer: Self-pay | Admitting: Family Medicine

## 2021-11-28 ENCOUNTER — Other Ambulatory Visit (HOSPITAL_COMMUNITY): Payer: Self-pay

## 2021-11-28 MED ORDER — LISDEXAMFETAMINE DIMESYLATE 30 MG PO CAPS
30.0000 mg | ORAL_CAPSULE | Freq: Every day | ORAL | 0 refills | Status: DC
  Filled 2021-11-28: qty 30, 30d supply, fill #0

## 2021-12-14 ENCOUNTER — Ambulatory Visit: Payer: 59 | Admitting: Family Medicine

## 2021-12-27 NOTE — Progress Notes (Signed)
Jason [name] [address] [name] [name] Phone: [phone] Subjective:   Jason [name], am serving as a scribe for Dr. Hulan [name]. This visit occurred during the SARS-CoV-2 public health emergency.  Safety protocols were in place, including screening questions prior to the visit, additional usage of staff PPE, and extensive cleaning of exam room while observing appropriate contact time as indicated for disinfecting solutions.   I'm seeing this patient by the request  of:  Jason Barrack, MD  CC: Neck and back pain follow-up  [id number]  Jason [name] is a 41 y.o. male coming in with complaint of back and neck pain. OMT on 11/02/2021. Patient states feel like trigger points are back, but smaller area. No other complaints  Medications patient has been prescribed: None  Taking:         Reviewed prior external information including notes and imaging from previsou exam, outside providers and external EMR if available.   As well as notes that were available from care everywhere and other healthcare systems.  Past medical history, social, surgical and family history all reviewed in electronic medical record.  No pertanent information unless stated regarding to the chief complaint.   Past Medical History:  Diagnosis Date   Allergy    Hypertension    Migraine     Allergies  Allergen Reactions   Penicillins      Review of Systems:  No headache, visual changes, nausea, vomiting, diarrhea, constipation, dizziness, abdominal pain, skin rash, fevers, chills, night sweats, weight loss, swollen lymph nodes, body aches, joint swelling, chest pain, shortness of breath, mood changes. POSITIVE muscle aches  Objective  Blood pressure 132/84, pulse 82, height 5\' 11"  (1.803 m), weight 197 lb (89.4 kg), SpO2 97 %.   General: No apparent distress alert and oriented x3 mood and affect normal, dressed appropriately.  HEENT: Pupils equal,  extraocular movements intact  Respiratory: Patient's speak in full sentences and does not appear short of breath  Cardiovascular: No lower extremity edema, non tender, no erythema  Shoulder blades do have multiple areas of tightness noted in the medial aspect left greater than right.  Osteopathic findings  C2 flexed rotated and side bent right C6 flexed rotated and side bent left T3 extended rotated and side bent left inhaled rib T9 extended rotated and side bent left with inhaled rib L2 flexed rotated and side bent right Sacrum right on right       Assessment and Plan:  Cervicogenic migraine Continue tightness in the neck.  Discussed icing regimen and home exercises, discussed which activities to do which wants to avoid.  Increase activity slowly.  Follow-up with me again in 6 to 8 weeks worsening pain can consider the possibility of trigger point injections again.   Nonallopathic problems  Decision today to treat with OMT was based on Physical Exam  After verbal consent patient was treated with HVLA, ME, FPR techniques in cervical, rib, thoracic, lumbar, and sacral  areas  Patient tolerated the procedure well with improvement in symptoms  Patient given exercises, stretches and lifestyle modifications  See medications in patient instructions if given  Patient will follow up in 4-8 weeks      The above documentation has been reviewed and is accurate and complete Jason Pulley, DO        Note: This dictation was prepared with Dragon dictation along with smaller phrase technology. Any transcriptional errors that result from this process are unintentional.

## 2021-12-28 ENCOUNTER — Other Ambulatory Visit: Payer: Self-pay

## 2021-12-28 ENCOUNTER — Ambulatory Visit (INDEPENDENT_AMBULATORY_CARE_PROVIDER_SITE_OTHER): Payer: 59 | Admitting: Family Medicine

## 2021-12-28 VITALS — BP 132/84 | HR 82 | Ht 71.0 in | Wt 197.0 lb

## 2021-12-28 DIAGNOSIS — M9903 Segmental and somatic dysfunction of lumbar region: Secondary | ICD-10-CM | POA: Diagnosis not present

## 2021-12-28 DIAGNOSIS — M9902 Segmental and somatic dysfunction of thoracic region: Secondary | ICD-10-CM

## 2021-12-28 DIAGNOSIS — M9904 Segmental and somatic dysfunction of sacral region: Secondary | ICD-10-CM | POA: Diagnosis not present

## 2021-12-28 DIAGNOSIS — G43809 Other migraine, not intractable, without status migrainosus: Secondary | ICD-10-CM | POA: Diagnosis not present

## 2021-12-28 DIAGNOSIS — M9908 Segmental and somatic dysfunction of rib cage: Secondary | ICD-10-CM

## 2021-12-28 DIAGNOSIS — M9901 Segmental and somatic dysfunction of cervical region: Secondary | ICD-10-CM

## 2021-12-28 NOTE — Patient Instructions (Signed)
Good to see you! Keep working on posture If it gets worse call us and we can work on trigger points See you again in 4-5 weeks

## 2021-12-28 NOTE — Assessment & Plan Note (Signed)
Continue tightness in the neck.  Discussed icing regimen and home exercises, discussed which activities to do which wants to avoid.  Increase activity slowly.  Follow-up with me again in 6 to 8 weeks worsening pain can consider the possibility of trigger point injections again.

## 2022-01-01 ENCOUNTER — Other Ambulatory Visit (HOSPITAL_COMMUNITY): Payer: Self-pay

## 2022-01-02 ENCOUNTER — Other Ambulatory Visit (HOSPITAL_COMMUNITY): Payer: Self-pay

## 2022-01-24 ENCOUNTER — Other Ambulatory Visit (HOSPITAL_COMMUNITY): Payer: Self-pay

## 2022-01-24 ENCOUNTER — Encounter: Payer: Self-pay | Admitting: Family Medicine

## 2022-01-24 NOTE — Telephone Encounter (Signed)
Please advise 

## 2022-01-25 ENCOUNTER — Other Ambulatory Visit (HOSPITAL_COMMUNITY): Payer: Self-pay

## 2022-01-25 MED ORDER — LISDEXAMFETAMINE DIMESYLATE 20 MG PO CAPS
20.0000 mg | ORAL_CAPSULE | Freq: Every day | ORAL | 0 refills | Status: DC
  Filled 2022-01-25: qty 30, 30d supply, fill #0

## 2022-01-25 NOTE — Telephone Encounter (Signed)
Pt verified DOB, advised Rx sent in 20mg  to Pharmacy. Pt verbalized understanding

## 2022-02-06 NOTE — Progress Notes (Signed)
?Terrilee Files D.O. ?[facility] ?[address] [place] [address] ?Phone: [phone] ?Subjective:   ?I, Wilford Grist, am serving as a scribe for Dr. Antoine Primas. ? ?This visit occurred during the SARS-CoV-2 public health emergency.  Safety protocols were in place, including screening questions prior to the visit, additional usage of staff PPE, and extensive cleaning of exam room while observing appropriate contact time as indicated for disinfecting solutions.  ? ? ?I'm seeing this patient by the request  of:  [name], MD ? ?CC: Neck and back pain follow-up ? ?[id number]  ?DELORIS MITTAG is a 41 y.o. male coming in with complaint of back and neck pain. OMT 12/28/2020. Patient states that he has persistent knot in R scapula. Unable to fully retract scapula.  ? ?Has had 3 headaches in occiput that radiate into the frontal bone that occur when he wakes up. Has not had any during the day.  ? ?Medications patient has been prescribed: None ? ?Taking: ? ? ?  ? ? ? ? ?Reviewed prior external information including notes and imaging from previsou exam, outside providers and external EMR if available.  ? ?As well as notes that were available from care everywhere and other healthcare systems. ? ?Past medical history, social, surgical and family history all reviewed in electronic medical record.  No pertanent information unless stated regarding to the chief complaint.  ? ?Past Medical History:  ?Diagnosis Date  ? Allergy   ? Hypertension   ? Migraine   ?  ?Allergies  ?Allergen Reactions  ? Penicillins   ? ? ? ?Review of Systems: ? No , visual changes, nausea, vomiting, diarrhea, constipation, dizziness, abdominal pain, skin rash, fevers, chills, night sweats, weight loss, swollen lymph nodes, body aches, joint swelling, chest pain, shortness of breath, mood changes. POSITIVE muscle aches, headache ? ?Objective  ?Blood pressure (!) 134/102, height 5\' 11"  (1.803 m), weight 198 lb (89.8 kg). ?   ?General: No apparent distress alert and oriented x3 mood and affect normal, dressed appropriately.  ?HEENT: Pupils equal, extraocular movements intact  ?Respiratory: Patient's speak in full sentences and does not appear short of breath  ?Cardiovascular: No lower extremity edema, non tender, no erythema  ?Significant tightness in the right parascapular region.  Scapular dyskinesis noted as well on the right side.  Patient does have some limited extension of the neck of the last 2 degrees as well as last 5 degrees of flexion.  Negative Spurling's.  Does have some very mild tightness also noted in the paraspinal musculature of the lumbar spine. ? ?Osteopathic findings ? ?C2 flexed rotated and side bent right ?C3 flexed rotated and side bent left ?T3 extended rotated and side bent right inhaled rib ?T9 extended rotated and side bent right with an inhaled rib ?L2 flexed rotated and side bent right ?Sacrum right on right ? ? ? ? ?  ?Assessment and Plan: ? ? ?Cervicogenic migraine ?Chronic problem with mild exacerbation.  Patient has been doing really doing relatively well though.  Has had only 3 severe headaches since we have seen him last.  Patient has still made the significant changes in the ergonomics and is lifting on a more regular basis.  Feels like that has been significantly more beneficial.  Discussed icing regimen and home exercises.  Increase activity slowly.  Follow-up again in 6 to 8 weeks  ?Nonallopathic problems ? ?Decision today to treat with OMT was based on Physical Exam ? ?After verbal consent patient was  treated with HVLA, ME, FPR techniques in cervical, rib, thoracic, lumbar, and sacral  areas ? ?Patient tolerated the procedure well with improvement in symptoms ? ?Patient given exercises, stretches and lifestyle modifications ? ?See medications in patient instructions if given ? ?Patient will follow up in 4-8 weeks ? ?  ? ? ?The above documentation has been reviewed and is accurate and complete  Judi Saa, DO ? ? ? ?  ? ? Note: This dictation was prepared with Dragon dictation along with smaller phrase technology. Any transcriptional errors that result from this process are unintentional.    ?  ?  ? ?

## 2022-02-08 ENCOUNTER — Ambulatory Visit (INDEPENDENT_AMBULATORY_CARE_PROVIDER_SITE_OTHER): Payer: 59 | Admitting: Family Medicine

## 2022-02-08 ENCOUNTER — Other Ambulatory Visit: Payer: Self-pay

## 2022-02-08 VITALS — BP 134/102 | Ht 71.0 in | Wt 198.0 lb

## 2022-02-08 DIAGNOSIS — M9901 Segmental and somatic dysfunction of cervical region: Secondary | ICD-10-CM

## 2022-02-08 DIAGNOSIS — M9903 Segmental and somatic dysfunction of lumbar region: Secondary | ICD-10-CM | POA: Diagnosis not present

## 2022-02-08 DIAGNOSIS — G43809 Other migraine, not intractable, without status migrainosus: Secondary | ICD-10-CM | POA: Diagnosis not present

## 2022-02-08 DIAGNOSIS — M9904 Segmental and somatic dysfunction of sacral region: Secondary | ICD-10-CM | POA: Diagnosis not present

## 2022-02-08 DIAGNOSIS — M9902 Segmental and somatic dysfunction of thoracic region: Secondary | ICD-10-CM

## 2022-02-08 DIAGNOSIS — M9908 Segmental and somatic dysfunction of rib cage: Secondary | ICD-10-CM

## 2022-02-08 NOTE — Assessment & Plan Note (Signed)
Chronic problem with mild exacerbation.  Patient has been doing really doing relatively well though.  Has had only 3 severe headaches since we have seen him last.  Patient has still made the significant changes in the ergonomics and is lifting on a more regular basis.  Feels like that has been significantly more beneficial.  Discussed icing regimen and home exercises.  Increase activity slowly.  Follow-up again in 6 to 8 weeks ?

## 2022-02-08 NOTE — Patient Instructions (Signed)
Great to see you ?Try to not be on phone as much ?See me in 2-3 months ?

## 2022-03-05 ENCOUNTER — Other Ambulatory Visit: Payer: Self-pay | Admitting: Family Medicine

## 2022-03-05 ENCOUNTER — Other Ambulatory Visit (HOSPITAL_COMMUNITY): Payer: Self-pay

## 2022-03-05 MED ORDER — LISDEXAMFETAMINE DIMESYLATE 20 MG PO CAPS
20.0000 mg | ORAL_CAPSULE | Freq: Every day | ORAL | 0 refills | Status: DC
  Filled 2022-03-05: qty 30, 30d supply, fill #0

## 2022-03-05 NOTE — Telephone Encounter (Signed)
Last visit: 09/14/21 ? ?Next visit: 05/31/21 ? ?Last filled: 01/25/21 ? ?Quantity: 30 ? ?

## 2022-03-15 ENCOUNTER — Encounter: Payer: Self-pay | Admitting: Family Medicine

## 2022-03-19 NOTE — Telephone Encounter (Signed)
I am sorry to hear that! We will certainly screen him when he comes back in for his next office visit.  Can we please update his family history in the chart? ? ?Jason Mclin M. Jimmey Ralph, MD ?03/19/2022 4:06 PM  ? ?

## 2022-03-21 ENCOUNTER — Encounter: Payer: Self-pay | Admitting: *Deleted

## 2022-03-29 DIAGNOSIS — F4323 Adjustment disorder with mixed anxiety and depressed mood: Secondary | ICD-10-CM | POA: Diagnosis not present

## 2022-04-11 ENCOUNTER — Other Ambulatory Visit: Payer: Self-pay | Admitting: Family Medicine

## 2022-04-11 ENCOUNTER — Other Ambulatory Visit (HOSPITAL_COMMUNITY): Payer: Self-pay

## 2022-04-11 MED ORDER — LISDEXAMFETAMINE DIMESYLATE 20 MG PO CAPS
20.0000 mg | ORAL_CAPSULE | Freq: Every day | ORAL | 0 refills | Status: DC
  Filled 2022-04-11: qty 30, 30d supply, fill #0

## 2022-04-12 DIAGNOSIS — F4323 Adjustment disorder with mixed anxiety and depressed mood: Secondary | ICD-10-CM | POA: Diagnosis not present

## 2022-04-26 ENCOUNTER — Other Ambulatory Visit (HOSPITAL_COMMUNITY): Payer: Self-pay

## 2022-04-26 DIAGNOSIS — F4323 Adjustment disorder with mixed anxiety and depressed mood: Secondary | ICD-10-CM | POA: Diagnosis not present

## 2022-05-01 NOTE — Progress Notes (Unsigned)
  Zach [facility] [place] [address] [place] Phone: [phone] Subjective:   IVilma Meckel, am serving as a scribe for Dr. Hulan Saas.  I'm seeing this patient by the request  of:  Vivi Barrack, MD  CC: Back and neck pain follow-up  [id number]  Jason [name] is a 41 y.o. male coming in with complaint of back and neck pain. OMT 02/08/2022. Patient states same per usual. No new complaints.  Medications patient has been prescribed: None  Taking:        Past Medical History:  Diagnosis Date   Allergy    Hypertension    Migraine     Allergies  Allergen Reactions   Penicillins      Review of Systems:  No headache, visual changes, nausea, vomiting, diarrhea, constipation, dizziness, abdominal pain, skin rash, fevers, chills, night sweats, weight loss, swollen lymph nodes, body aches, joint swelling, chest pain, shortness of breath, mood changes. POSITIVE muscle aches  Objective  Blood pressure 118/86, pulse 91, height 5\' 11"  ([id number] m), weight 201 lb (91.2 kg), SpO2 98 %.   General: No apparent distress alert and oriented x3 mood and affect normal, dressed appropriately.  HEENT: Pupils equal, extraocular movements intact  Respiratory: Patient's speak in full sentences and does not appear short of breath  Cardiovascular: No lower extremity edema, non tender, no erythema  Neck exam does have some loss of lordosis.  Some tenderness to palpation in the paraspinal musculature.Has tightness noted in the right side of the scapular region.Low back mild tightness noted Mild increase in low back pain as well.   Osteopathic findings  C2 flexed rotated and side bent right C6 flexed rotated and side bent left T3 extended rotated and side bent right inhaled rib T9 extended rotated and side bent left L2 flexed rotated and side bent right Sacrum right on right       Assessment and Plan:  Cervicogenic migraine Stable,  improving but mild scapular dyskenesis discussed with patient continue with the osteopathic manipulation, continue with the posture and ergonomics.  Patient can have the muscle relaxers if needed for any acute tear but seems that patient is continuing to improve.  Follow-up again in 2 to 3 months   Nonallopathic problems  Decision today to treat with OMT was based on Physical Exam  After verbal consent patient was treated with HVLA, ME, FPR techniques in cervical, rib, thoracic, lumbar, and sacral  areas  Patient tolerated the procedure well with improvement in symptoms  Patient given exercises, stretches and lifestyle modifications  See medications in patient instructions if given  Patient will follow up in 4-8 weeks     The above documentation has been reviewed and is accurate and complete Lyndal Pulley, DO        Note: This dictation was prepared with Dragon dictation along with smaller phrase technology. Any transcriptional errors that result from this process are unintentional.

## 2022-05-03 ENCOUNTER — Ambulatory Visit (INDEPENDENT_AMBULATORY_CARE_PROVIDER_SITE_OTHER): Payer: 59 | Admitting: Family Medicine

## 2022-05-03 VITALS — BP 118/86 | HR 91 | Ht 71.0 in | Wt 201.0 lb

## 2022-05-03 DIAGNOSIS — G43809 Other migraine, not intractable, without status migrainosus: Secondary | ICD-10-CM | POA: Diagnosis not present

## 2022-05-03 DIAGNOSIS — M9901 Segmental and somatic dysfunction of cervical region: Secondary | ICD-10-CM

## 2022-05-03 DIAGNOSIS — M9902 Segmental and somatic dysfunction of thoracic region: Secondary | ICD-10-CM | POA: Diagnosis not present

## 2022-05-03 DIAGNOSIS — M9903 Segmental and somatic dysfunction of lumbar region: Secondary | ICD-10-CM

## 2022-05-03 DIAGNOSIS — M9908 Segmental and somatic dysfunction of rib cage: Secondary | ICD-10-CM | POA: Diagnosis not present

## 2022-05-03 DIAGNOSIS — M9904 Segmental and somatic dysfunction of sacral region: Secondary | ICD-10-CM

## 2022-05-03 NOTE — Assessment & Plan Note (Signed)
Stable, improving but mild scapular dyskenesis discussed with patient continue with the osteopathic manipulation, continue with the posture and ergonomics.  Patient can have the muscle relaxers if needed for any acute tear but seems that patient is continuing to improve.  Follow-up again in 2 to 3 months

## 2022-05-03 NOTE — Patient Instructions (Signed)
A little more lat exercises with pullovers Yoga wheel Making great strides See you again in 2 months

## 2022-05-06 ENCOUNTER — Other Ambulatory Visit: Payer: Self-pay | Admitting: Family Medicine

## 2022-05-06 ENCOUNTER — Other Ambulatory Visit (HOSPITAL_COMMUNITY): Payer: Self-pay

## 2022-05-06 MED ORDER — LISDEXAMFETAMINE DIMESYLATE 20 MG PO CAPS
20.0000 mg | ORAL_CAPSULE | Freq: Every day | ORAL | 0 refills | Status: DC
  Filled 2022-05-06 – 2022-05-10 (×2): qty 30, 30d supply, fill #0

## 2022-05-09 ENCOUNTER — Encounter: Payer: Self-pay | Admitting: Family Medicine

## 2022-05-10 ENCOUNTER — Other Ambulatory Visit (HOSPITAL_COMMUNITY): Payer: Self-pay

## 2022-05-10 ENCOUNTER — Other Ambulatory Visit: Payer: Self-pay | Admitting: Family Medicine

## 2022-05-10 DIAGNOSIS — F4323 Adjustment disorder with mixed anxiety and depressed mood: Secondary | ICD-10-CM | POA: Diagnosis not present

## 2022-05-10 NOTE — Telephone Encounter (Signed)
Called pharmacy spoke to pharmacist Vyvanse is ready for pick up. Called pt told him Rx is ready for pickup.

## 2022-05-16 ENCOUNTER — Encounter: Payer: Self-pay | Admitting: Family Medicine

## 2022-05-17 NOTE — Telephone Encounter (Signed)
Please see message. °

## 2022-05-24 ENCOUNTER — Encounter: Payer: 59 | Admitting: Family Medicine

## 2022-05-24 DIAGNOSIS — F4323 Adjustment disorder with mixed anxiety and depressed mood: Secondary | ICD-10-CM | POA: Diagnosis not present

## 2022-05-31 ENCOUNTER — Other Ambulatory Visit (HOSPITAL_COMMUNITY): Payer: Self-pay

## 2022-05-31 ENCOUNTER — Encounter: Payer: Self-pay | Admitting: Physician Assistant

## 2022-05-31 ENCOUNTER — Encounter: Payer: 59 | Admitting: Family Medicine

## 2022-05-31 ENCOUNTER — Ambulatory Visit (INDEPENDENT_AMBULATORY_CARE_PROVIDER_SITE_OTHER): Payer: 59 | Admitting: Physician Assistant

## 2022-05-31 VITALS — BP 110/80 | HR 82 | Temp 98.1°F | Ht 70.5 in | Wt 201.2 lb

## 2022-05-31 DIAGNOSIS — I10 Essential (primary) hypertension: Secondary | ICD-10-CM

## 2022-05-31 DIAGNOSIS — Z1159 Encounter for screening for other viral diseases: Secondary | ICD-10-CM

## 2022-05-31 DIAGNOSIS — Z114 Encounter for screening for human immunodeficiency virus [HIV]: Secondary | ICD-10-CM

## 2022-05-31 DIAGNOSIS — Z125 Encounter for screening for malignant neoplasm of prostate: Secondary | ICD-10-CM | POA: Diagnosis not present

## 2022-05-31 DIAGNOSIS — Z0000 Encounter for general adult medical examination without abnormal findings: Secondary | ICD-10-CM

## 2022-05-31 DIAGNOSIS — F909 Attention-deficit hyperactivity disorder, unspecified type: Secondary | ICD-10-CM

## 2022-05-31 DIAGNOSIS — Z8042 Family history of malignant neoplasm of prostate: Secondary | ICD-10-CM

## 2022-05-31 DIAGNOSIS — E663 Overweight: Secondary | ICD-10-CM

## 2022-05-31 LAB — CBC WITH DIFFERENTIAL/PLATELET
Basophils Absolute: 0 10*3/uL (ref 0.0–0.1)
Basophils Relative: 0.5 % (ref 0.0–3.0)
Eosinophils Absolute: 0.1 10*3/uL (ref 0.0–0.7)
Eosinophils Relative: 1.1 % (ref 0.0–5.0)
HCT: 43.4 % (ref 39.0–52.0)
Hemoglobin: 14.5 g/dL (ref 13.0–17.0)
Lymphocytes Relative: 22.2 % (ref 12.0–46.0)
Lymphs Abs: 2 10*3/uL (ref 0.7–4.0)
MCHC: 33.4 g/dL (ref 30.0–36.0)
MCV: 88.7 fl (ref 78.0–100.0)
Monocytes Absolute: 0.9 10*3/uL (ref 0.1–1.0)
Monocytes Relative: 9.5 % (ref 3.0–12.0)
Neutro Abs: 6 10*3/uL (ref 1.4–7.7)
Neutrophils Relative %: 66.7 % (ref 43.0–77.0)
Platelets: 279 10*3/uL (ref 150.0–400.0)
RBC: 4.9 Mil/uL (ref 4.22–5.81)
RDW: 13.6 % (ref 11.5–15.5)
WBC: 9 10*3/uL (ref 4.0–10.5)

## 2022-05-31 LAB — LIPID PANEL
Cholesterol: 185 mg/dL (ref 0–200)
HDL: 52.1 mg/dL (ref >39.00)
LDL Cholesterol: 103 mg/dL — ABNORMAL HIGH (ref 0–99)
NonHDL: 132.83
Total CHOL/HDL Ratio: 4
Triglycerides: 151 mg/dL — ABNORMAL HIGH (ref 0.0–149.0)
VLDL: 30.2 mg/dL (ref 0.0–40.0)

## 2022-05-31 LAB — COMPREHENSIVE METABOLIC PANEL
ALT: 34 U/L (ref 0–53)
AST: 22 U/L (ref 0–37)
Albumin: 4.9 g/dL (ref 3.5–5.2)
Alkaline Phosphatase: 55 U/L (ref 39–117)
BUN: 25 mg/dL — ABNORMAL HIGH (ref 6–23)
CO2: 31 mEq/L (ref 19–32)
Calcium: 9.4 mg/dL (ref 8.4–10.5)
Chloride: 103 mEq/L (ref 96–112)
Creatinine, Ser: 1.29 mg/dL (ref 0.40–1.50)
GFR: 70.25 mL/min (ref >60.00)
Glucose, Bld: 93 mg/dL (ref 70–99)
Potassium: 4 mEq/L (ref 3.5–5.1)
Sodium: 141 mEq/L (ref 135–145)
Total Bilirubin: 1.8 mg/dL — ABNORMAL HIGH (ref 0.2–1.2)
Total Protein: 7 g/dL (ref 6.0–8.3)

## 2022-05-31 LAB — PSA: PSA: 0.72 ng/mL (ref 0.10–4.00)

## 2022-05-31 MED ORDER — LOSARTAN POTASSIUM 25 MG PO TABS
25.0000 mg | ORAL_TABLET | Freq: Every day | ORAL | 3 refills | Status: DC
  Filled 2022-05-31: qty 90, 90d supply, fill #0
  Filled 2022-10-23: qty 90, 90d supply, fill #1

## 2022-05-31 NOTE — Patient Instructions (Signed)
It was great to see you!  I've sent in new dosage of losartan 25 mg daily -- please cut in half and continue to taper.  Please go to the lab for blood work.   Our office will call you with your results unless you have chosen to receive results via MyChart.  If your blood work is normal we will follow-up each year for physicals and as scheduled for chronic medical problems.  If anything is abnormal we will treat accordingly and get you in for a follow-up.  Take care,  Lelon Mast

## 2022-05-31 NOTE — Progress Notes (Signed)
Subjective:    Jason [name] is a 41 y.o. male and is here for a comprehensive physical exam.  HPI  Health Maintenance Due  Topic Date Due   HIV Screening  Never done   Hepatitis C Screening  Never done   COVID-19 Vaccine (4 - Moderna series) 11/18/2020    Acute Concerns: None  Chronic Issues: ADHD Patient is currently taking Vyvanse 20 mg daily with no side effects. He states this has been helping and would like to continue with this. Denies any worsening sx.   HTN Currently taking Losartan 25 mg daily with no complications. At home blood pressure readings are: WNL. Patient denies chest pain, SOB, blurred vision, dizziness, unusual headaches, lower leg swelling. Patient is  compliant with medication. Denies excessive caffeine intake, stimulant usage, excessive alcohol intake, or increase in salt consumption.  BP Readings from Last 3 Encounters:  05/31/22 110/80  05/03/22 118/86  02/08/22 (!) 134/102   Additionally, he would like to check his PSA levels. Does have hx of prostate cancer in his family.  Health Maintenance: Immunizations -- UTD Covid-UTD Colonoscopy -- N/A PSA -- No results found for: "PSA1", "PSA" Diet -- Balanced diet-green powders daily.  Sleep habits -- No concern  Exercise -- regularly Weight -- 210 Ib (91.3 kg) Weight history Wt Readings from Last 10 Encounters:  05/31/22 201 lb 4 oz (91.3 kg)  05/03/22 201 lb (91.2 kg)  02/08/22 198 lb (89.8 kg)  12/28/21 197 lb (89.4 kg)  11/02/21 198 lb (89.8 kg)  09/14/21 196 lb 9.6 oz (89.2 kg)  09/07/21 198 lb (89.8 kg)  08/03/21 197 lb (89.4 kg)  07/06/21 198 lb (89.8 kg)  05/25/21 203 lb 9.6 oz (92.4 kg)   Body mass index is 28.47 kg/m. Mood -- Better Tobacco use -- None Tobacco Use: Low Risk  (05/31/2022)   Patient History    Smoking Tobacco Use: Never    Smokeless Tobacco Use: Never    Passive Exposure: Not on file    Alcohol use ---  reports current alcohol use.      05/31/2022    12:56 PM  Depression screen PHQ 2/9  Decreased Interest 0  Down, Depressed, Hopeless 0  PHQ - 2 Score 0     Other providers/specialists: Patient Care Team: [name], MD as PCP - General (Family Medicine)   PMHx, SurgHx, SocialHx, Medications, and Allergies were reviewed in the Visit Navigator and updated as appropriate.   Past Medical History:  Diagnosis Date   Allergy    Hypertension    Migraine      Past Surgical History:  Procedure Laterality Date   ADENOIDECTOMY     SALIVARY GLAND SURGERY     TONSILLECTOMY     WISDOM TOOTH EXTRACTION       Family History  Problem Relation Age of Onset   Prostate cancer Father    Skin cancer Father    Stroke Paternal Grandmother    Prostate cancer Paternal Grandfather    Stroke Paternal Grandfather    Parkinson's disease Paternal Grandfather    Prostate cancer Paternal Uncle     Social History   Tobacco Use   Smoking status: Never   Smokeless tobacco: Never  Vaping Use   Vaping Use: Never used  Substance Use Topics   Alcohol use: Yes    Comment: almost none    Review of Systems:   Review of Systems  Constitutional:  Negative for chills, fever, malaise/fatigue and weight  loss.  HENT:  Negative for hearing loss, sinus pain and sore throat.   Respiratory:  Negative for cough and hemoptysis.   Cardiovascular:  Negative for chest pain, palpitations, leg swelling and PND.  Gastrointestinal:  Negative for abdominal pain, constipation, diarrhea, heartburn, nausea and vomiting.  Genitourinary:  Negative for dysuria, frequency and urgency.  Musculoskeletal:  Negative for back pain, myalgias and neck pain.  Skin:  Negative for itching and rash.  Neurological:  Negative for dizziness, tingling, seizures and headaches.  Endo/Heme/Allergies:  Negative for polydipsia.  Psychiatric/Behavioral:  Negative for depression. The patient is not nervous/anxious.      Objective:   Vitals:   05/31/22 1256  BP: 110/80   Pulse: 82  Temp: 98.1 F (36.7 C)  SpO2: 98%   Body mass index is 28.47 kg/m.  General Appearance:  Alert, cooperative, no distress, appears stated age  Head:  Normocephalic, without obvious abnormality, atraumatic  Eyes:  PERRL, conjunctiva/corneas clear, EOM's intact, fundi benign, both eyes       Ears:  Normal TM's and external ear canals, both ears  Nose: Nares normal, septum midline, mucosa normal, no drainage    or sinus tenderness  Throat: Lips, mucosa, and tongue normal; teeth and gums normal  Neck: Supple, symmetrical, trachea midline, no adenopathy; thyroid:  No enlargement/tenderness/nodules; no carotit bruit or JVD  Back:   Symmetric, no curvature, ROM normal, no CVA tenderness  Lungs:   Clear to auscultation bilaterally, respirations unlabored  Chest wall:  No tenderness or deformity  Heart:  Regular rate and rhythm, S1 and S2 normal, no murmur, rub   or gallop  Abdomen:   Soft, non-tender, bowel sounds active all four quadrants, no masses, no organomegaly  Extremities: Extremities normal, atraumatic, no cyanosis or edema  Prostate: Not done.   Skin: Skin color, texture, turgor normal, no rashes or lesions  Lymph nodes: Cervical, supraclavicular, and axillary nodes normal  Neurologic: CNII-XII grossly intact. Normal strength, sensation and reflexes throughout    Assessment/Plan:   Routine physical examination Today patient counseled on age appropriate routine health concerns for screening and prevention, each reviewed and up to date or declined. Immunizations reviewed and up to date or declined. Labs ordered and reviewed. Risk factors for depression reviewed and negative. Hearing function and visual acuity are intact. ADLs screened and addressed as needed. Functional ability and level of safety reviewed and appropriate. Education, counseling and referrals performed based on assessed risks today. Patient provided with a copy of personalized plan for preventive  services.  Screening for HIV (human immunodeficiency virus) Update today  Encounter for screening for other viral diseases Update today  Overweight Continue healthy diet and exercise  Essential hypertension Normotensive He is feeling orthostatic hypotension at times -- will decrease Losartan to 12.5 mg daily He will continue to taper as he sees necessary Follow-up in 1 year, sooner if concerns  Attention deficit hyperactivity disorder (ADHD), unspecified ADHD type Well controlled He would like refill of Vyvanse 20 mg daily when this is due PDMP reviewed - no red flags  Prostate cancer screening; Family history of prostate cancer I discussed that typically PSA screening typically starts at age 18 if family history but he would like this today  Patient Counseling: [x]   Nutrition: Stressed importance of moderation in sodium/caffeine intake, saturated fat and cholesterol, caloric balance, sufficient intake of fresh fruits, vegetables, and fiber.  [x]   Stressed the importance of regular exercise.   []   Substance Abuse: Discussed cessation/primary prevention of tobacco,  alcohol, or other drug use; driving or other dangerous activities under the influence; availability of treatment for abuse.   [x]   Injury prevention: Discussed safety belts, safety helmets, smoke detector, smoking near bedding or upholstery.   []   Sexuality: Discussed sexually transmitted diseases, partner selection, use of condoms, avoidance of unintended pregnancy  and contraceptive alternatives.   [x]   Dental health: Discussed importance of regular tooth brushing, flossing, and dental visits.  [x]   Health maintenance and immunizations reviewed. Please refer to Health maintenance section.     I,Savera Zaman,acting as a for , PA.,have documented all relevant documentation on the behalf of , PA,as directed by  , PA while in the presence of Neurosurgeon, [employer].   I,  Jarold Motto, Jarold Motto, have reviewed all documentation for this visit. The documentation on 05/31/22 for the exam, diagnosis, procedures, and orders are all accurate and complete.   [place], PA-C [facility]

## 2022-06-03 LAB — HIV ANTIBODY (ROUTINE TESTING W REFLEX): HIV 1&2 Ab, 4th Generation: NONREACTIVE

## 2022-06-03 LAB — HEPATITIS C ANTIBODY: Hepatitis C Ab: NONREACTIVE

## 2022-06-07 DIAGNOSIS — F4323 Adjustment disorder with mixed anxiety and depressed mood: Secondary | ICD-10-CM | POA: Diagnosis not present

## 2022-06-11 ENCOUNTER — Other Ambulatory Visit (HOSPITAL_COMMUNITY): Payer: Self-pay

## 2022-06-11 ENCOUNTER — Other Ambulatory Visit: Payer: Self-pay | Admitting: Family Medicine

## 2022-06-11 MED ORDER — LISDEXAMFETAMINE DIMESYLATE 20 MG PO CAPS
20.0000 mg | ORAL_CAPSULE | Freq: Every day | ORAL | 0 refills | Status: DC
  Filled 2022-06-11: qty 30, 30d supply, fill #0

## 2022-06-11 NOTE — Telephone Encounter (Signed)
Pt requesting refill for Vyvanse 20 mg. Last OV 05/2022.

## 2022-06-21 DIAGNOSIS — F4323 Adjustment disorder with mixed anxiety and depressed mood: Secondary | ICD-10-CM | POA: Diagnosis not present

## 2022-07-09 ENCOUNTER — Other Ambulatory Visit: Payer: Self-pay | Admitting: Family Medicine

## 2022-07-09 ENCOUNTER — Other Ambulatory Visit (HOSPITAL_COMMUNITY): Payer: Self-pay

## 2022-07-09 MED ORDER — LISDEXAMFETAMINE DIMESYLATE 20 MG PO CAPS
20.0000 mg | ORAL_CAPSULE | Freq: Every day | ORAL | 0 refills | Status: DC
  Filled 2022-07-09: qty 30, 30d supply, fill #0

## 2022-07-12 ENCOUNTER — Ambulatory Visit: Payer: 59 | Admitting: Family Medicine

## 2022-07-26 DIAGNOSIS — F4323 Adjustment disorder with mixed anxiety and depressed mood: Secondary | ICD-10-CM | POA: Diagnosis not present

## 2022-07-29 ENCOUNTER — Encounter: Payer: Self-pay | Admitting: Family Medicine

## 2022-07-30 ENCOUNTER — Other Ambulatory Visit (HOSPITAL_COMMUNITY): Payer: Self-pay

## 2022-07-30 MED ORDER — PREDNISONE 50 MG PO TABS
50.0000 mg | ORAL_TABLET | Freq: Every day | ORAL | 0 refills | Status: DC
  Filled 2022-07-30: qty 5, 5d supply, fill #0

## 2022-07-30 NOTE — Progress Notes (Signed)
  [name] [address] [place] [address] Phone: [phone] Subjective:   Jason [name], am serving as a scribe for Dr. Antoine Primas.  I'm seeing this patient by the request  of:  [name], MD  CC: Neck pain, headache  [id number]  Jason [name] is a 41 y.o. male coming in with complaint of back and neck pain. OMT 05/03/2022. Patient states he has had headaches at the base of his skull for about two days and he was wanting to ask about that.  Patient felt like it did not seem to be exacerbated after his recent illness.  Feels like he does have some lymph node enlargement on the right side  Medications patient has been prescribed: None  Taking:         Reviewed prior external information including notes and imaging from previsou exam, outside providers and external EMR if available.   As well as notes that were available from care everywhere and other healthcare systems.  Past medical history, social, surgical and family history all reviewed in electronic medical record.  No pertanent information unless stated regarding to the chief complaint.   Past Medical History:  Diagnosis Date   Allergy    Hypertension    Migraine     Allergies  Allergen Reactions   Penicillins      Review of Systems:  No headache, visual changes, nausea, vomiting, diarrhea, constipation, dizziness, abdominal pain, skin rash, fevers, chills, night sweats, weight loss, swollen lymph nodes, body aches, joint swelling, chest pain, shortness of breath, mood changes. POSITIVE muscle aches  Objective  Blood pressure 132/84, pulse 78, height 5' 10.5" (1.791 m), weight 199 lb (90.3 kg), SpO2 98 %.   General: No apparent distress alert and oriented x3 mood and affect normal, dressed appropriately.  HEENT: Pupils equal, extraocular movements intact  Respiratory: Patient's speak in full sentences and does not appear short of breath  Cardiovascular:  No lower extremity edema, non tender, no erythema  Gait MSK:  Back does have some very mild loss of lordosis.  Neck exam is significantly tight with the last 5 degrees of extension, 5 degrees of flexion and sidebending bilaterally.  Osteopathic findings  C2 flexed rotated and side bent right C6 flexed rotated and side bent right  T3 extended rotated and side bent right inhaled rib T9 extended rotated and side bent left     Assessment and Plan:  Cervicogenic migraine Chronic, with exacerbation secondary to patient's most recent illness.  I do believe the patient has some mild lymphadenopathy still contributing.  Discussed with patient about icing regimen and home exercises otherwise discussed avoiding certain activities that I think would be more beneficial.  Follow-up with me 5-6 weeks.    Nonallopathic problems  Decision today to treat with OMT was based on Physical Exam  After verbal consent patient was treated with HVLA, ME, FPR techniques in cervical, rib, thoracic, areas  Patient tolerated the procedure well with improvement in symptoms  Patient given exercises, stretches and lifestyle modifications  See medications in patient instructions if given  Patient will follow up in 4-8 weeks      The above documentation has been reviewed and is accurate and complete Judi Saa, DO        Note: This dictation was prepared with Dragon dictation along with smaller phrase technology. Any transcriptional errors that result from this process are unintentional.

## 2022-07-31 ENCOUNTER — Telehealth: Payer: 59 | Admitting: Family Medicine

## 2022-07-31 DIAGNOSIS — H00012 Hordeolum externum right lower eyelid: Secondary | ICD-10-CM | POA: Diagnosis not present

## 2022-07-31 NOTE — Progress Notes (Signed)
  E-Visit for Stye   We are sorry that you are not feeling well. Here is how we plan to help!  Based on what you have shared with me it looks like you have a stye.  A stye is an inflammation of the eyelid.  It is often a red, painful lump near the edge of the eyelid that may look like a boil or a pimple.  A stye develops when an infection occurs at the base of an eyelash.   We have made appropriate suggestions for you based upon your presentation: Simple styes can be treated without medical intervention.  Most styes either resolve spontaneously or resolve with simple home treatment by applying warm compresses or heated washcloth to the stye for about 10-15 minutes three to four times a day. This causes the stye to drain and resolve.  They can take 1-2 weeks to fully clear on their own.   HOME CARE:  Wash your hands often! Let the stye open on its own. Don't squeeze or open it. Don't rub your eyes. This can irritate your eyes and let in bacteria.  If you need to touch your eyes, wash your hands first. Don't wear eye makeup or contact lenses until the area has healed.  GET HELP RIGHT AWAY IF:  Your symptoms do not improve. You develop blurred or loss of vision. Your symptoms worsen (increased discharge, pain or redness).   Thank you for choosing an e-visit.  Your e-visit answers were reviewed by a board certified advanced clinical practitioner to complete your personal care plan. Depending upon the condition, your plan could have included both over the counter or prescription medications.  Please review your pharmacy choice. Make sure the pharmacy is open so you can pick up prescription now. If there is a problem, you may contact your provider through [employer] and have the prescription routed to another pharmacy.  Your safety is important to [place]. If you have drug allergies check your prescription carefully.   For the next 24 hours you can use MyChart to ask questions about  today's visit, request a non-urgent call back, or ask for a work or school excuse. You will get an email in the next two days asking about your experience. I hope that your e-visit has been valuable and will speed your recovery.   I provided 5 minutes of non face-to-face time during this encounter for chart review, medication and order placement, as well as and documentation.

## 2022-08-09 ENCOUNTER — Encounter: Payer: Self-pay | Admitting: Family Medicine

## 2022-08-09 ENCOUNTER — Ambulatory Visit (INDEPENDENT_AMBULATORY_CARE_PROVIDER_SITE_OTHER): Payer: 59 | Admitting: Family Medicine

## 2022-08-09 VITALS — BP 132/84 | HR 78 | Ht 70.5 in | Wt 199.0 lb

## 2022-08-09 DIAGNOSIS — M9908 Segmental and somatic dysfunction of rib cage: Secondary | ICD-10-CM | POA: Diagnosis not present

## 2022-08-09 DIAGNOSIS — G43809 Other migraine, not intractable, without status migrainosus: Secondary | ICD-10-CM | POA: Diagnosis not present

## 2022-08-09 DIAGNOSIS — M9901 Segmental and somatic dysfunction of cervical region: Secondary | ICD-10-CM | POA: Diagnosis not present

## 2022-08-09 DIAGNOSIS — M9902 Segmental and somatic dysfunction of thoracic region: Secondary | ICD-10-CM

## 2022-08-09 NOTE — Assessment & Plan Note (Signed)
Chronic, with exacerbation secondary to patient's most recent illness.  I do believe the patient has some mild lymphadenopathy still contributing.  Discussed with patient about icing regimen and home exercises otherwise discussed avoiding certain activities that I think would be more beneficial.  Follow-up with me 5-6 weeks.

## 2022-08-09 NOTE — Patient Instructions (Addendum)
Good to see you  Vit 2000 units daily  Zinc 30mg   Continue coq10 Follow up in 5 weeks

## 2022-08-16 ENCOUNTER — Encounter: Payer: Self-pay | Admitting: Physician Assistant

## 2022-08-16 ENCOUNTER — Other Ambulatory Visit: Payer: Self-pay | Admitting: Family Medicine

## 2022-08-16 ENCOUNTER — Other Ambulatory Visit (HOSPITAL_COMMUNITY): Payer: Self-pay

## 2022-08-16 MED ORDER — LISDEXAMFETAMINE DIMESYLATE 20 MG PO CAPS
20.0000 mg | ORAL_CAPSULE | Freq: Every day | ORAL | 0 refills | Status: DC
  Filled 2022-12-03: qty 30, 30d supply, fill #0

## 2022-08-16 MED ORDER — LISDEXAMFETAMINE DIMESYLATE 20 MG PO CAPS
20.0000 mg | ORAL_CAPSULE | Freq: Every day | ORAL | 0 refills | Status: DC
  Filled 2022-09-17: qty 30, 30d supply, fill #0

## 2022-08-16 MED ORDER — LISDEXAMFETAMINE DIMESYLATE 20 MG PO CAPS
20.0000 mg | ORAL_CAPSULE | Freq: Every day | ORAL | 0 refills | Status: DC
  Filled 2022-08-16: qty 30, 30d supply, fill #0

## 2022-08-16 NOTE — Telephone Encounter (Signed)
Dr. Jimmey Ralph, please see message. Pt is scheduled to see you 08/23/2022. Please give enough Vyvanse till appt.

## 2022-08-16 NOTE — Telephone Encounter (Signed)
Patient scheduled an appointment for 08/23/22 at 7:20 am with DR. Parker.   Patient requests a RX for enough Vyvase to get him through until his appointment (4 pills).  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  Pharmacy is  [facility] Phone:  [phone]  Fax:  [phone]

## 2022-08-23 ENCOUNTER — Other Ambulatory Visit (HOSPITAL_COMMUNITY): Payer: Self-pay

## 2022-08-23 ENCOUNTER — Encounter: Payer: Self-pay | Admitting: Family Medicine

## 2022-08-23 ENCOUNTER — Ambulatory Visit: Payer: 59 | Admitting: Family Medicine

## 2022-08-23 VITALS — BP 138/92 | HR 82 | Temp 98.6°F | Ht 70.5 in | Wt 200.2 lb

## 2022-08-23 DIAGNOSIS — F909 Attention-deficit hyperactivity disorder, unspecified type: Secondary | ICD-10-CM

## 2022-08-23 DIAGNOSIS — G43809 Other migraine, not intractable, without status migrainosus: Secondary | ICD-10-CM

## 2022-08-23 DIAGNOSIS — F4323 Adjustment disorder with mixed anxiety and depressed mood: Secondary | ICD-10-CM | POA: Diagnosis not present

## 2022-08-23 DIAGNOSIS — I10 Essential (primary) hypertension: Secondary | ICD-10-CM

## 2022-08-23 MED ORDER — TIZANIDINE HCL 4 MG PO TABS
4.0000 mg | ORAL_TABLET | Freq: Four times a day (QID) | ORAL | 0 refills | Status: AC | PRN
  Filled 2022-08-23: qty 30, 8d supply, fill #0

## 2022-08-23 NOTE — Progress Notes (Signed)
   Jason [name] is a 41 y.o. male who presents today for an office visit.  Assessment/Plan:  Chronic Problems Addressed Today: ADHD Doing well on Vyvanse 20 mg daily.  Refills were sent in a week ago.  No significant side effects.  Medications help ability stay focused and on task at work.  He will follow-up in 6 months for CPE.  Cervicogenic migraine Has been following with sports medicine for this.  We will refill Zanaflex today at slightly higher dose 4 mg every 6 hours as needed.  We discussed another prednisone burst however he declined.  He will follow-up with sports medicine if not improving.  Essential hypertension Stable on losartan 25 mg daily.  Continue home monitoring.  Follow-up again in 6 months.  He will let me know if persistently elevated at home.     Subjective:  HPI:  See a/p for status of chronic conditions.        Objective:  Physical Exam: BP (!) 138/92   Pulse 82   Temp 98.6 F (37 C) (Temporal)   Ht 5' 10.5" (1.791 m)   Wt 200 lb 3.2 oz (90.8 kg)   SpO2 100%   BMI 28.32 kg/m   Gen: No acute distress, resting comfortably CV: Regular rate and rhythm with no murmurs appreciated Pulm: Normal work of breathing, clear to auscultation bilaterally with no crackles, wheezes, or rhonchi Neuro: Grossly normal, moves all extremities Psych: Normal affect and thought content      Carlisle Torgeson M. Jerline Pain, MD 08/23/2022 7:53 AM

## 2022-08-23 NOTE — Assessment & Plan Note (Signed)
Stable on losartan 25 mg daily.  Continue home monitoring.  Follow-up again in 6 months.  He will let me know if persistently elevated at home.

## 2022-08-23 NOTE — Patient Instructions (Signed)
It was very nice to see you today!  We will send in more Zanaflex at a higher dose.  Please keep an eye on your blood pressure and let me know if persistently elevated.  We will see back in 6 months for your annual physical.  Please come back to see me sooner if needed.  Take care, Dr Jerline Pain  PLEASE NOTE:  If you had any lab tests please let us know if you have not heard back within a few days. You may see your results on mychart before we have a chance to review them but we will give you a call once they are reviewed by [place]. If we ordered any referrals today, please let us know if you have not heard from their office within the next week.   Please try these tips to maintain a healthy lifestyle:  Eat at least 3 REAL meals and 1-2 snacks per day.  Aim for no more than 5 hours between eating.  If you eat breakfast, please do so within one hour of getting up.   Each meal should contain half fruits/vegetables, one quarter protein, and one quarter carbs (no bigger than a computer mouse)  Cut down on sweet beverages. This includes juice, soda, and sweet tea.   Drink at least 1 glass of water with each meal and aim for at least 8 glasses per day  Exercise at least 150 minutes every week.

## 2022-08-23 NOTE — Assessment & Plan Note (Signed)
Has been following with sports medicine for this.  We will refill Zanaflex today at slightly higher dose 4 mg every 6 hours as needed.  We discussed another prednisone burst however he declined.  He will follow-up with sports medicine if not improving.

## 2022-08-23 NOTE — Assessment & Plan Note (Signed)
Doing well on Vyvanse 20 mg daily.  Refills were sent in a week ago.  No significant side effects.  Medications help ability stay focused and on task at work.  He will follow-up in 6 months for CPE.

## 2022-09-06 ENCOUNTER — Ambulatory Visit: Payer: 59 | Admitting: Family Medicine

## 2022-09-13 DIAGNOSIS — F4323 Adjustment disorder with mixed anxiety and depressed mood: Secondary | ICD-10-CM | POA: Diagnosis not present

## 2022-09-17 ENCOUNTER — Other Ambulatory Visit (HOSPITAL_COMMUNITY): Payer: Self-pay

## 2022-10-14 ENCOUNTER — Other Ambulatory Visit (HOSPITAL_COMMUNITY): Payer: Self-pay

## 2022-10-14 ENCOUNTER — Ambulatory Visit: Payer: 59 | Admitting: Family Medicine

## 2022-10-14 ENCOUNTER — Encounter: Payer: Self-pay | Admitting: Family Medicine

## 2022-10-14 VITALS — BP 134/81 | HR 89 | Temp 97.1°F | Ht 70.5 in | Wt 201.0 lb

## 2022-10-14 DIAGNOSIS — F909 Attention-deficit hyperactivity disorder, unspecified type: Secondary | ICD-10-CM

## 2022-10-14 DIAGNOSIS — I10 Essential (primary) hypertension: Secondary | ICD-10-CM | POA: Diagnosis not present

## 2022-10-14 MED ORDER — BUPROPION HCL ER (XL) 150 MG PO TB24
150.0000 mg | ORAL_TABLET | Freq: Every day | ORAL | 5 refills | Status: DC
  Filled 2022-10-14: qty 30, 30d supply, fill #0
  Filled 2022-11-11: qty 30, 30d supply, fill #1

## 2022-10-14 NOTE — Assessment & Plan Note (Signed)
Initially elevated though at goal on recheck.  Continue current medication regimen losartan 25 mg daily.  He will continue to monitor at home and let us know if persistently elevated.

## 2022-10-14 NOTE — Assessment & Plan Note (Signed)
Symptoms are reasonably well controlled however is having some side effects including decreased sex drive as well as insomnia.  He did try stopping the medication and noticed significant improvement in sex drive.  We discussed management strategies including switching to alternative ADHD medication such as Adderall, Concerta or Strattera.  We also discussed adding on medication to help augment and mitigate some of the side effects.  We will add on Wellbutrin 150 mg daily.  We discussed major side effects.  He can follow-up with me in a few weeks via MyChart.  If symptoms are still bothersome would consider switching to alternative ADHD medication instead.

## 2022-10-14 NOTE — Progress Notes (Signed)
   JONAH GINGRAS is a 41 y.o. male who presents today for an office visit.  Assessment/Plan:  Chronic Problems Addressed Today: ADHD Symptoms are reasonably well controlled however is having some side effects including decreased sex drive as well as insomnia.  He did try stopping the medication and noticed significant improvement in sex drive.  We discussed management strategies including switching to alternative ADHD medication such as Adderall, Concerta or Strattera.  We also discussed adding on medication to help augment and mitigate some of the side effects.  We will add on Wellbutrin 150 mg daily.  We discussed major side effects.  He can follow-up with me in a few weeks via MyChart.  If symptoms are still bothersome would consider switching to alternative ADHD medication instead.  Essential hypertension Initially elevated though at goal on recheck.  Continue current medication regimen losartan 25 mg daily.  He will continue to monitor at home and let us know if persistently elevated.     Subjective:  HPI:  See A/p for status of chronic conditions.        Objective:  Physical Exam: BP 134/81   Pulse 89   Temp (!) 97.1 F (36.2 C) (Temporal)   Ht 5' 10.5" (1.791 m)   Wt 201 lb (91.2 kg)   SpO2 100%   BMI 28.43 kg/m   Gen: No acute distress, resting comfortably CV: Regular rate and rhythm with no murmurs appreciated Pulm: Normal work of breathing, clear to auscultation bilaterally with no crackles, wheezes, or rhonchi Neuro: Grossly normal, moves all extremities Psych: Normal affect and thought content      Jolette Lana M. Jimmey Ralph, MD 10/14/2022 7:48 AM

## 2022-10-14 NOTE — Patient Instructions (Addendum)
It was very nice to see you today!  Please try the wellbutrin.  Let me know in a few weeks how you are doing.  We may switch to a different medication if needed.  Take care, Dr Jimmey Ralph  PLEASE NOTE:  If you had any lab tests please let us know if you have not heard back within a few days. You may see your results on mychart before we have a chance to review them but we will give you a call once they are reviewed by [place]. If we ordered any referrals today, please let us know if you have not heard from their office within the next week.   Please try these tips to maintain a healthy lifestyle:  Eat at least 3 REAL meals and 1-2 snacks per day.  Aim for no more than 5 hours between eating.  If you eat breakfast, please do so within one hour of getting up.   Each meal should contain half fruits/vegetables, one quarter protein, and one quarter carbs (no bigger than a computer mouse)  Cut down on sweet beverages. This includes juice, soda, and sweet tea.   Drink at least 1 glass of water with each meal and aim for at least 8 glasses per day  Exercise at least 150 minutes every week.

## 2022-10-18 ENCOUNTER — Ambulatory Visit: Payer: 59 | Admitting: Family Medicine

## 2022-10-23 ENCOUNTER — Other Ambulatory Visit: Payer: Self-pay | Admitting: Family Medicine

## 2022-10-23 ENCOUNTER — Other Ambulatory Visit (HOSPITAL_COMMUNITY): Payer: Self-pay

## 2022-10-23 MED ORDER — LISDEXAMFETAMINE DIMESYLATE 20 MG PO CAPS
20.0000 mg | ORAL_CAPSULE | Freq: Every day | ORAL | 0 refills | Status: DC
  Filled 2022-10-23: qty 30, 30d supply, fill #0

## 2022-10-23 NOTE — Telephone Encounter (Signed)
Pt requesting refill for Vyvanse 20 mg. Last OV 10/14/2022.

## 2022-10-30 NOTE — Progress Notes (Signed)
[name] [address] [place] [address] Phone: [phone] Subjective:   Jason [name], am serving as a scribe for Dr. Antoine Primas.  I'm seeing this patient by the request  of:  [name], MD  CC: Back and neck pain follow-up  [name]  [name] is a 41 y.o. male coming in with complaint of back and neck pain. OMT 08/09/2022. Patient states that he is having tightness in R scapula. Use self manipulation to work out the knot. Also having some tightness on the neck area.  Knows that this is from work limited.  Headaches have been relatively well-controlled.  Medications patient has been prescribed: None  Taking:         Reviewed prior external information including notes and imaging from previsou exam, outside providers and external EMR if available.   As well as notes that were available from care everywhere and other healthcare systems.  Past medical history, social, surgical and family history all reviewed in electronic medical record.  No pertanent information unless stated regarding to the chief complaint.   Past Medical History:  Diagnosis Date   Allergy    Hypertension    Migraine     Allergies  Allergen Reactions   Penicillins      Review of Systems:  No headache, visual changes, nausea, vomiting, diarrhea, constipation, dizziness, abdominal pain, skin rash, fevers, chills, night sweats, weight loss, swollen lymph nodes, body aches, joint swelling, chest pain, shortness of breath, mood changes. POSITIVE muscle aches  Objective  Blood pressure (!) 122/92, pulse 90, height 5' 10.5" (1.791 m), weight 201 lb (91.2 kg), SpO2 99 %.   General: No apparent distress alert and oriented x3 mood and affect normal, dressed appropriately.  HEENT: Pupils equal, extraocular movements intact  Respiratory: Patient's speak in full sentences and does not appear short of breath  Cardiovascular: No lower extremity  edema, non tender, no erythema  Gait MSK:  Back   Osteopathic findings  C2 flexed rotated and side bent right C6 flexed rotated and side bent left T3 extended rotated and side bent right inhaled rib T9 extended rotated and side bent left L2 flexed rotated and side bent right Sacrum right on right       Assessment and Plan:  Cervicogenic migraine Patient did have a CT of the right that he definitely was going to ask.  We discussed with patient about posture and ergonomics otherwise.  Has been doing well.  Discussed the chronic pain in the shoulder blade.  Still seems to be more ergonomics than anything else.  Patient will follow-up again in 2 months  Somatic dysfunction of spine, cervical   Decision today to treat with OMT was based on Physical Exam  After verbal consent patient was treated with HVLA, ME, FPR techniques in cervical, thoracic, rib, lumbar and sacral areas, all areas are chronic   Patient tolerated the procedure well with improvement in symptoms  Patient given exercises, stretches and lifestyle modifications  See medications in patient instructions if given  Patient will follow up in 4-8 weeks  Chronic back pain Scapular seems to be the worst.  Discussed posture and ergonomics otherwise.  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  This could be contributing to some of the back pain.  Patient did describe some intermittent knee pain that also could  be contributing.  Given some home exercises at this time.  Follow-up again in 2 months worsening pain consider formal physical therapy or ultrasound to further evaluate.    Nonallopathic problems  Decision today to treat with OMT was based on Physical Exam  After verbal consent patient was treated with HVLA, ME, FPR techniques in cervical, rib, thoracic, lumbar, and  sacral  areas  Patient tolerated the procedure well with improvement in symptoms  Patient given exercises, stretches and lifestyle modifications  See medications in patient instructions if given  Patient will follow up in 4-8 weeks     The above documentation has been reviewed and is accurate and complete Judi Saa, DO         Note: This dictation was prepared with Dragon dictation along with smaller phrase technology. Any transcriptional errors that result from this process are unintentional.

## 2022-11-01 ENCOUNTER — Ambulatory Visit (INDEPENDENT_AMBULATORY_CARE_PROVIDER_SITE_OTHER): Payer: 59 | Admitting: Family Medicine

## 2022-11-01 VITALS — BP 122/92 | HR 90 | Ht 70.5 in | Wt 201.0 lb

## 2022-11-01 DIAGNOSIS — M76821 Posterior tibial tendinitis, right leg: Secondary | ICD-10-CM | POA: Diagnosis not present

## 2022-11-01 DIAGNOSIS — M9904 Segmental and somatic dysfunction of sacral region: Secondary | ICD-10-CM | POA: Diagnosis not present

## 2022-11-01 DIAGNOSIS — G8929 Other chronic pain: Secondary | ICD-10-CM | POA: Diagnosis not present

## 2022-11-01 DIAGNOSIS — M9908 Segmental and somatic dysfunction of rib cage: Secondary | ICD-10-CM

## 2022-11-01 DIAGNOSIS — H5213 Myopia, bilateral: Secondary | ICD-10-CM | POA: Diagnosis not present

## 2022-11-01 DIAGNOSIS — M76822 Posterior tibial tendinitis, left leg: Secondary | ICD-10-CM

## 2022-11-01 DIAGNOSIS — M9903 Segmental and somatic dysfunction of lumbar region: Secondary | ICD-10-CM

## 2022-11-01 DIAGNOSIS — M549 Dorsalgia, unspecified: Secondary | ICD-10-CM

## 2022-11-01 DIAGNOSIS — G43809 Other migraine, not intractable, without status migrainosus: Secondary | ICD-10-CM

## 2022-11-01 DIAGNOSIS — M9902 Segmental and somatic dysfunction of thoracic region: Secondary | ICD-10-CM | POA: Diagnosis not present

## 2022-11-01 DIAGNOSIS — M9901 Segmental and somatic dysfunction of cervical region: Secondary | ICD-10-CM | POA: Diagnosis not present

## 2022-11-01 NOTE — Assessment & Plan Note (Addendum)
Patient did have a C2 of the right that he definitely was going to ask.  We discussed with patient about posture and ergonomics otherwise.  Has been doing well.  Discussed the chronic pain in the shoulder blade.  Still seems to be more ergonomics than anything else.  Patient will follow-up again in 2 months

## 2022-11-01 NOTE — Patient Instructions (Signed)
Good to see you [employer]! See me in 2 months

## 2022-11-01 NOTE — Assessment & Plan Note (Signed)
Patient does have posterior tibialis tendinitis with significant overpronation of the hindfoot.  This could be contributing to some of the back pain.  Patient did describe some intermittent knee pain that also could be contributing.  Given some home exercises at this time.  Follow-up again in 2 months worsening pain consider formal physical therapy or ultrasound to further evaluate.

## 2022-11-01 NOTE — Assessment & Plan Note (Signed)

## 2022-11-01 NOTE — Assessment & Plan Note (Signed)
Scapular seems to be the worst.  Discussed posture and ergonomics otherwise.  Follow-up again in 6 to 8 weeks

## 2022-11-08 DIAGNOSIS — F4323 Adjustment disorder with mixed anxiety and depressed mood: Secondary | ICD-10-CM | POA: Diagnosis not present

## 2022-11-11 ENCOUNTER — Encounter: Payer: Self-pay | Admitting: Family Medicine

## 2022-11-11 NOTE — Telephone Encounter (Signed)
See patient note

## 2022-11-11 NOTE — Telephone Encounter (Signed)
I appreciate the update. I am glad he is doing well! We can continue current dose for now and discuss at his next office visit.  Katina Degree. Jimmey Ralph, MD 11/11/2022 12:59 PM

## 2022-11-13 ENCOUNTER — Other Ambulatory Visit (HOSPITAL_COMMUNITY): Payer: Self-pay

## 2022-12-03 ENCOUNTER — Other Ambulatory Visit: Payer: Self-pay

## 2022-12-03 ENCOUNTER — Other Ambulatory Visit (HOSPITAL_COMMUNITY): Payer: Self-pay

## 2022-12-03 NOTE — Telephone Encounter (Signed)
I appreciate the update. I hate to hear that he has has some side effects. I am ok with him stopping the wellbutrin. He should let us know if he runs into any issues.   Algis Greenhouse. Jerline Pain, MD 12/03/2022 9:35 PM

## 2022-12-06 DIAGNOSIS — F4323 Adjustment disorder with mixed anxiety and depressed mood: Secondary | ICD-10-CM | POA: Diagnosis not present

## 2023-01-10 ENCOUNTER — Ambulatory Visit: Payer: Self-pay | Admitting: Family Medicine

## 2023-01-14 ENCOUNTER — Other Ambulatory Visit (HOSPITAL_COMMUNITY): Payer: Self-pay

## 2023-01-14 ENCOUNTER — Other Ambulatory Visit: Payer: Self-pay | Admitting: Family Medicine

## 2023-01-15 ENCOUNTER — Other Ambulatory Visit (HOSPITAL_BASED_OUTPATIENT_CLINIC_OR_DEPARTMENT_OTHER): Payer: Self-pay

## 2023-01-15 MED ORDER — LISDEXAMFETAMINE DIMESYLATE 20 MG PO CAPS
20.0000 mg | ORAL_CAPSULE | Freq: Every day | ORAL | 0 refills | Status: DC
  Filled 2023-01-15: qty 30, 30d supply, fill #0

## 2023-01-15 NOTE — Telephone Encounter (Signed)
Pt states only one pharmacy has the medication: [facility] location

## 2023-01-24 DIAGNOSIS — F4323 Adjustment disorder with mixed anxiety and depressed mood: Secondary | ICD-10-CM | POA: Diagnosis not present

## 2023-02-06 NOTE — Progress Notes (Signed)
[facility] [place] Phone: [phone] Subjective:   Jason [name], am serving as a scribe for Dr. Hulan Saas.  I'm seeing this patient by the request  of:  Jason Barrack, MD  CC: Back and neck pain  [id number]  Jason [name] is a 42 y.o. male coming in with complaint of back and neck pain. OMT 11/01/2022. Patient states that he continues to have R sided scapular pain. Patient notes that he has a quick jolt of pain when standing in the middle of thoracic lumbar area. Denies any radiating symptoms. Symptoms occurring for past 2 months.   Medications patient has been prescribed: None  Taking:         Reviewed prior external information including notes and imaging from previsou exam, outside providers and external EMR if available.   As well as notes that were available from care everywhere and other healthcare systems.  Past medical history, social, surgical and family history all reviewed in electronic medical record.  [name] pertanent information unless stated regarding to the chief complaint.   Past Medical History:  Diagnosis Date   Allergy    Hypertension    Migraine     Allergies  Allergen Reactions   Penicillins      Review of Systems:  [name] headache, visual changes, nausea, vomiting, diarrhea, constipation, dizziness, abdominal pain, skin rash, fevers, chills, night sweats, weight loss, swollen lymph nodes, body aches, joint swelling, chest pain, shortness of breath, mood changes. POSITIVE muscle aches  Objective  Blood pressure 128/88, pulse 88, height 5\' 10"  (1.778 m), weight 200 lb (90.7 kg), SpO2 98 %.   General: [name] apparent distress alert and oriented x3 mood and affect normal, dressed appropriately.  HEENT: Pupils equal, extraocular movements intact  Respiratory: Patient's speak in full sentences and does not appear short of breath  Cardiovascular: [name] lower extremity edema, non tender,  [name] erythema  Back does have loss of lordosis noted.  [name] tenderness to palpation in the paraspinal musculature.  The patient does have more pain in the sacroiliac joint than usual.  Neck exam does have tightness noted on the right side.  Patient does have some limited sidebending to the left and rotation to the right.  Tightness in the parascapular area.  Osteopathic findings  C2 flexed rotated and side bent right C6 flexed rotated and side bent right  T3 extended rotated and side bent right inhaled rib T9 extended rotated and side bent left L2 flexed rotated and side bent right Sacrum right on right       Assessment and Plan:  Cervicogenic migraine Patient has been doing relatively well overall.  Has had tightness still noted in the neck and the right side of the parascapular area.  Did respond well to osteopathic manipulation.  Discussed other self manipulation techniques that could be beneficial.  Discussed ergonomics that I think will be helpful as well.  Follow-up with me again in 6 to 8 weeks otherwise.  Chronic back pain Has had back pain and seems to be over the sacroiliac joint, long enough now that I do feel that imaging is warranted and x-rays ordered today.  Discussed home exercises and icing regimen otherwise.  Follow-up again in 6 to 8 weeks    Nonallopathic problems  Decision today to treat with OMT was based on Physical Exam  After verbal consent patient was treated with HVLA, ME, FPR techniques in cervical, rib, thoracic, lumbar, and  sacral  areas  Patient tolerated the procedure well with improvement in symptoms  Patient given exercises, stretches and lifestyle modifications  See medications in patient instructions if given  Patient will follow up in 4-8 weeks     The above documentation has been reviewed and is accurate and complete Jason Pulley, DO         Note: This dictation was prepared with Dragon dictation along with smaller phrase  technology. Any transcriptional errors that result from this process are unintentional.

## 2023-02-07 ENCOUNTER — Ambulatory Visit (INDEPENDENT_AMBULATORY_CARE_PROVIDER_SITE_OTHER): Payer: 59 | Admitting: Family Medicine

## 2023-02-07 ENCOUNTER — Ambulatory Visit (INDEPENDENT_AMBULATORY_CARE_PROVIDER_SITE_OTHER): Payer: 59

## 2023-02-07 ENCOUNTER — Encounter: Payer: Self-pay | Admitting: Family Medicine

## 2023-02-07 VITALS — BP 128/88 | HR 88 | Ht 70.0 in | Wt 200.0 lb

## 2023-02-07 DIAGNOSIS — M9903 Segmental and somatic dysfunction of lumbar region: Secondary | ICD-10-CM | POA: Diagnosis not present

## 2023-02-07 DIAGNOSIS — G43809 Other migraine, not intractable, without status migrainosus: Secondary | ICD-10-CM

## 2023-02-07 DIAGNOSIS — M549 Dorsalgia, unspecified: Secondary | ICD-10-CM

## 2023-02-07 DIAGNOSIS — M9901 Segmental and somatic dysfunction of cervical region: Secondary | ICD-10-CM | POA: Diagnosis not present

## 2023-02-07 DIAGNOSIS — G8929 Other chronic pain: Secondary | ICD-10-CM

## 2023-02-07 DIAGNOSIS — M9904 Segmental and somatic dysfunction of sacral region: Secondary | ICD-10-CM | POA: Diagnosis not present

## 2023-02-07 DIAGNOSIS — M48061 Spinal stenosis, lumbar region without neurogenic claudication: Secondary | ICD-10-CM | POA: Diagnosis not present

## 2023-02-07 DIAGNOSIS — M9902 Segmental and somatic dysfunction of thoracic region: Secondary | ICD-10-CM

## 2023-02-07 DIAGNOSIS — M4807 Spinal stenosis, lumbosacral region: Secondary | ICD-10-CM | POA: Diagnosis not present

## 2023-02-07 DIAGNOSIS — M545 Low back pain, unspecified: Secondary | ICD-10-CM

## 2023-02-07 DIAGNOSIS — M9908 Segmental and somatic dysfunction of rib cage: Secondary | ICD-10-CM | POA: Diagnosis not present

## 2023-02-07 NOTE — Patient Instructions (Signed)
Good to see you  X ray on the way out Hip abductor exercises given Follow up in 7-8 weeks

## 2023-02-07 NOTE — Assessment & Plan Note (Signed)
Has had back pain and seems to be over the sacroiliac joint, long enough now that I do feel that imaging is warranted and x-rays ordered today.  Discussed home exercises and icing regimen otherwise.  Follow-up again in 6 to 8 weeks

## 2023-02-07 NOTE — Assessment & Plan Note (Signed)
Patient has been doing relatively well overall.  Has had tightness still noted in the neck and the right side of the parascapular area.  Did respond well to osteopathic manipulation.  Discussed other self manipulation techniques that could be beneficial.  Discussed ergonomics that I think will be helpful as well.  Follow-up with me again in 6 to 8 weeks otherwise.

## 2023-02-14 ENCOUNTER — Other Ambulatory Visit (HOSPITAL_COMMUNITY): Payer: Self-pay

## 2023-02-14 ENCOUNTER — Encounter: Payer: Self-pay | Admitting: Family Medicine

## 2023-02-14 ENCOUNTER — Ambulatory Visit (INDEPENDENT_AMBULATORY_CARE_PROVIDER_SITE_OTHER): Payer: 59 | Admitting: Family Medicine

## 2023-02-14 VITALS — BP 160/97 | HR 81 | Temp 97.7°F | Ht 70.0 in | Wt 200.0 lb

## 2023-02-14 DIAGNOSIS — J302 Other seasonal allergic rhinitis: Secondary | ICD-10-CM

## 2023-02-14 DIAGNOSIS — F909 Attention-deficit hyperactivity disorder, unspecified type: Secondary | ICD-10-CM

## 2023-02-14 DIAGNOSIS — I10 Essential (primary) hypertension: Secondary | ICD-10-CM | POA: Diagnosis not present

## 2023-02-14 DIAGNOSIS — F4323 Adjustment disorder with mixed anxiety and depressed mood: Secondary | ICD-10-CM | POA: Diagnosis not present

## 2023-02-14 MED ORDER — LISDEXAMFETAMINE DIMESYLATE 30 MG PO CAPS
30.0000 mg | ORAL_CAPSULE | Freq: Every day | ORAL | 0 refills | Status: DC
  Filled 2023-02-14: qty 30, 30d supply, fill #0

## 2023-02-14 NOTE — Assessment & Plan Note (Signed)
Blood pressure elevated today though typically has been well-controlled.  Will continue losartan 25 mg daily.  He will monitor at home and at work and let us know if persistently elevated.

## 2023-02-14 NOTE — Progress Notes (Signed)
   Jason [name] is a 42 y.o. male who presents today for an office visit.  Assessment/Plan:  Chronic Problems Addressed Today: ADHD Database without red flags.  Overall does feel improvement while on Vyvanse with ability to stay focused and on task however9 he does feel like the Vyvanse could be a little bit stronger.  We will try 30 mg daily.  We discussed potential side effects.  He will follow-up with me in a couple of weeks via MyChart to let us know how he is doing with the higher dose.    Seasonal allergies Still having ongoing issues.  Recommend he take Astelin and Flonase at the same time.  Will refer to ENT per patient request.  Essential hypertension Blood pressure elevated today though typically has been well-controlled.  Will continue losartan 25 mg daily.  He will monitor at home and at work and let us know if persistently elevated.     Subjective:  HPI:  See A/p for status of chronic conditions.    Overall doing well today.  Our last visit was tried Wellbutrin.  This caused significant side effects and he stopped this.  Still having a lot of ongoing nasal congestion.  Alternates Astelin and Flonase both of which worked mostly well though still has ongoing issues.  Is not currently taking any oral antihistamine medications for this.  He is interested possibly seeing ENT for this.  This has been an ongoing issue for several years at this point.  Overall feels like the Vyvanse works modestly well though does feel like this could be a little bit stronger.  Some days seem to be better than others.  No significant side effects.       Objective:  Physical Exam: BP (!) 160/97   Pulse 81   Temp 97.7 F (36.5 C) (Temporal)   Ht 5\' 10"  (1.778 m)   Wt 200 lb (90.7 kg)   SpO2 99%   BMI 28.70 kg/m   Gen: No acute distress, resting comfortably CV: Regular rate and rhythm with no murmurs appreciated Pulm: Normal work of breathing, clear to auscultation bilaterally with no  crackles, wheezes, or rhonchi Neuro: Grossly normal, moves all extremities Psych: Normal affect and thought content      Tylynn Braniff M. Jerline Pain, MD 02/14/2023 9:07 AM

## 2023-02-14 NOTE — Assessment & Plan Note (Signed)
Still having ongoing issues.  Recommend he take Astelin and Flonase at the same time.  Will refer to ENT per patient request.

## 2023-02-14 NOTE — Patient Instructions (Signed)
It was very nice to see you today!  We will increase your Vyvanse to 30 mg daily.  See me a message in a few weeks to let me know how this is working.  Please keep an eye on your blood pressure and let us know if it is persistently elevated.  Please try taking your Flonase and Astelin at the same time to see if this helps with your allergy symptoms.  I will see back in 6 months for your physical.  Come back sooner if needed.  Take care, Dr Jerline Pain  PLEASE NOTE:  If you had any lab tests, please let us know if you have not heard back within a few days. You may see your results on mychart before we have a chance to review them but we will give you a call once they are reviewed by [place].   If we ordered any referrals today, please let us know if you have not heard from their office within the next week.   If you had any urgent prescriptions sent in today, please check with the pharmacy within an hour of our visit to make sure the prescription was transmitted appropriately.   Please try these tips to maintain a healthy lifestyle:  Eat at least 3 REAL meals and 1-2 snacks per day.  Aim for no more than 5 hours between eating.  If you eat breakfast, please do so within one hour of getting up.   Each meal should contain half fruits/vegetables, one quarter protein, and one quarter carbs (no bigger than a computer mouse)  Cut down on sweet beverages. This includes juice, soda, and sweet tea.   Drink at least 1 glass of water with each meal and aim for at least 8 glasses per day  Exercise at least 150 minutes every week.

## 2023-02-14 NOTE — Assessment & Plan Note (Signed)
Database without red flags.  Overall does feel improvement while on Vyvanse with ability to stay focused and on task however9 he does feel like the Vyvanse could be a little bit stronger.  We will try 30 mg daily.  We discussed potential side effects.  He will follow-up with me in a couple of weeks via MyChart to let us know how he is doing with the higher dose.

## 2023-03-12 ENCOUNTER — Encounter: Payer: Self-pay | Admitting: Family Medicine

## 2023-03-12 NOTE — Telephone Encounter (Signed)
See note

## 2023-03-13 ENCOUNTER — Other Ambulatory Visit (HOSPITAL_COMMUNITY): Payer: Self-pay

## 2023-03-13 ENCOUNTER — Other Ambulatory Visit: Payer: Self-pay

## 2023-03-13 MED ORDER — LISDEXAMFETAMINE DIMESYLATE 40 MG PO CAPS
40.0000 mg | ORAL_CAPSULE | ORAL | 0 refills | Status: DC
  Filled 2023-03-13 (×2): qty 30, 30d supply, fill #0

## 2023-03-14 ENCOUNTER — Other Ambulatory Visit: Payer: Self-pay

## 2023-03-14 DIAGNOSIS — F4323 Adjustment disorder with mixed anxiety and depressed mood: Secondary | ICD-10-CM | POA: Diagnosis not present

## 2023-03-19 ENCOUNTER — Telehealth: Payer: 59 | Admitting: Physician Assistant

## 2023-03-19 ENCOUNTER — Other Ambulatory Visit (HOSPITAL_COMMUNITY): Payer: Self-pay

## 2023-03-19 ENCOUNTER — Other Ambulatory Visit: Payer: Self-pay | Admitting: Physician Assistant

## 2023-03-19 DIAGNOSIS — L03019 Cellulitis of unspecified finger: Secondary | ICD-10-CM

## 2023-03-19 MED ORDER — SULFAMETHOXAZOLE-TRIMETHOPRIM 800-160 MG PO TABS
1.0000 | ORAL_TABLET | Freq: Two times a day (BID) | ORAL | 0 refills | Status: DC
Start: 2023-03-19 — End: 2023-03-19
  Filled 2023-03-19: qty 14, 7d supply, fill #0

## 2023-03-19 MED ORDER — CEPHALEXIN 500 MG PO CAPS
500.0000 mg | ORAL_CAPSULE | Freq: Two times a day (BID) | ORAL | 0 refills | Status: DC
  Filled 2023-03-19: qty 14, 7d supply, fill #0

## 2023-03-19 MED ORDER — CEPHALEXIN 500 MG PO CAPS
500.0000 mg | ORAL_CAPSULE | Freq: Four times a day (QID) | ORAL | 0 refills | Status: AC
  Filled 2023-03-19: qty 28, 7d supply, fill #0

## 2023-03-19 NOTE — Progress Notes (Signed)
E Visit for Cellulitis  We are sorry that you are not feeling well. Here is how we plan to help!  Based on what you shared with me it looks like you have a paronychia (infection around the nail bed) and possible cellulitis.  Cellulitis looks like areas of skin redness, swelling, and warmth; it develops as a result of bacteria entering under the skin. Little red spots and/or bleeding can be seen in skin, and tiny surface sacs containing fluid can occur. Fever can be present.   Keep skin clean and dry. I recommend soaking for 10 minutes 1-2 times day in a small bowl of 1 part hydrogen peroxide to 2 part water. Make sure to pat completely dry.   I have prescribed:  Bactrim DS 1 tablet by mouth twice a day for 7 days  HOME CARE:  Take your medications as ordered and take all of them, even if the skin irritation appears to be healing.   GET HELP RIGHT AWAY IF:  Symptoms that don't begin to go away within 48 hours. Severe redness persists or worsens If the area turns color, spreads or swells. If it blisters and opens, develops yellow-brown crust or bleeds. You develop a fever or chills. If the pain increases or becomes unbearable.  Are unable to keep fluids and food down.  MAKE SURE YOU   Understand these instructions. Will watch your condition. Will get help right away if you are not doing well or get worse.  Thank you for choosing an e-visit.  Your e-visit answers were reviewed by a board certified advanced clinical practitioner to complete your personal care plan. Depending upon the condition, your plan could have included both over the counter or prescription medications.  Please review your pharmacy choice. Make sure the pharmacy is open so you can pick up prescription now. If there is a problem, you may contact your provider through [employer] and have the prescription routed to another pharmacy.  Your safety is important to [place]. If you have drug allergies check your  prescription carefully.   For the next 24 hours you can use MyChart to ask questions about today's visit, request a non-urgent call back, or ask for a work or school excuse. You will get an email in the next two days asking about your experience. I hope that your e-visit has been valuable and will speed your recovery.

## 2023-03-19 NOTE — Progress Notes (Signed)
I have spent 5 minutes in review of e-visit questionnaire, review and updating patient chart, medical decision making and response to patient.   Devora Tortorella Cody Carley Glendenning, PA-C    

## 2023-03-19 NOTE — Addendum Note (Signed)
Addended by: Waldon Merl on: 03/19/2023 07:23 AM   Modules accepted: Orders

## 2023-04-16 NOTE — Progress Notes (Signed)
[name] [address] [place] [address] Phone: [phone] Subjective:   Jason [name], am serving as a scribe for Dr. Antoine [name].  I'm seeing this patient by the request  of:  Jason Dark, MD  CC: Foot pain, neck pain  [id number]  Jason [name] is a 42 y.o. male coming in with complaint of back and neck pain. OMT 02/07/2023. Also f/u for shoulder pain. Patient states that he continues to have pain in scapula. Using COOP pillow but having headaches at occiput. Wants to know if Medcline pillow would be beneficial.   Patient is also having pain over medial longitudinal arch for past month. Wearing HOKA recovery sandals and bought new HOKA shoes. Slowly getting better but still bothering him with weightbearing.   Medications patient has been prescribed:   Taking:  Lumbar xray 02/07/2023 IMPRESSION: Minor lower lumbar degenerative change. No acute finding by plain radiography.         Reviewed prior external information including notes and imaging from previsou exam, outside providers and external EMR if available.   As well as notes that were available from care everywhere and other healthcare systems.  Past medical history, social, surgical and family history all reviewed in electronic medical record.  No pertanent information unless stated regarding to the chief complaint.   Past Medical History:  Diagnosis Date   Allergy    Hypertension    Migraine     Allergies  Allergen Reactions   Penicillins      Review of Systems:  No headache, visual changes, nausea, vomiting, diarrhea, constipation, dizziness, abdominal pain, skin rash, fevers, chills, night sweats, weight loss, swollen lymph nodes, body aches, joint swelling, chest pain, shortness of breath, mood changes. POSITIVE muscle aches  Objective  Blood pressure 122/82, pulse 83, height 5\' 10"  (1.778 m).   General: No apparent distress alert and oriented x3  mood and affect normal, dressed appropriately.  HEENT: Pupils equal, extraocular movements intact  Respiratory: Patient's speak in full sentences and does not appear short of breath  Cardiovascular: No lower extremity edema, non tender, no erythema  Gait normal.  Patient's ankle is noted to have overpronation of the hindfoot.  Breakdown of the transverse arch noted bilaterally with bunion and bunionette formation noted. MSK:  Back does have some mild loss of lordosis.  Tightness still noted in the scapular area.  Mild trigger points noted.  Osteopathic findings  C3 flexed rotated and side bent right C7 flexed rotated and side bent right T3 extended rotated and side bent right inhaled rib T8 extended rotated and side bent left L3 flexed rotated and side bent right Sacrum right on right       Assessment and Plan:  Cervicogenic migraine Has been doing relatively well right now with the headaches and everything.  We discussed with patient about different pillows, discussed which activities to do and which ones to avoid.  Patient has made ergonomic changes that has made significant improvement.  Increase activity slowly.  Follow-up with me again in 6 to 8 weeks    Nonallopathic problems  Decision today to treat with OMT was based on Physical Exam  After verbal consent patient was treated with HVLA, ME, FPR techniques in cervical, rib, thoracic, lumbar, and sacral  areas  Patient tolerated the procedure well with improvement in symptoms  Patient given exercises, stretches and lifestyle modifications  See medications in patient instructions if given  Patient will follow up in  4-8 weeks    The above documentation has been reviewed and is accurate and complete Jason Saa, DO          Note: This dictation was prepared with Dragon dictation along with smaller phrase technology. Any transcriptional errors that result from this process are unintentional.

## 2023-04-18 ENCOUNTER — Ambulatory Visit (INDEPENDENT_AMBULATORY_CARE_PROVIDER_SITE_OTHER): Payer: 59 | Admitting: Family Medicine

## 2023-04-18 ENCOUNTER — Other Ambulatory Visit: Payer: Self-pay

## 2023-04-18 VITALS — BP 122/82 | HR 83 | Ht 70.0 in

## 2023-04-18 DIAGNOSIS — M9901 Segmental and somatic dysfunction of cervical region: Secondary | ICD-10-CM

## 2023-04-18 DIAGNOSIS — M9904 Segmental and somatic dysfunction of sacral region: Secondary | ICD-10-CM | POA: Diagnosis not present

## 2023-04-18 DIAGNOSIS — M9908 Segmental and somatic dysfunction of rib cage: Secondary | ICD-10-CM | POA: Diagnosis not present

## 2023-04-18 DIAGNOSIS — M25512 Pain in left shoulder: Secondary | ICD-10-CM | POA: Diagnosis not present

## 2023-04-18 DIAGNOSIS — G8929 Other chronic pain: Secondary | ICD-10-CM

## 2023-04-18 DIAGNOSIS — M25511 Pain in right shoulder: Secondary | ICD-10-CM

## 2023-04-18 DIAGNOSIS — G43809 Other migraine, not intractable, without status migrainosus: Secondary | ICD-10-CM | POA: Diagnosis not present

## 2023-04-18 DIAGNOSIS — M76822 Posterior tibial tendinitis, left leg: Secondary | ICD-10-CM

## 2023-04-18 DIAGNOSIS — M9902 Segmental and somatic dysfunction of thoracic region: Secondary | ICD-10-CM | POA: Diagnosis not present

## 2023-04-18 DIAGNOSIS — M9903 Segmental and somatic dysfunction of lumbar region: Secondary | ICD-10-CM

## 2023-04-18 DIAGNOSIS — M76821 Posterior tibial tendinitis, right leg: Secondary | ICD-10-CM | POA: Diagnosis not present

## 2023-04-18 NOTE — Assessment & Plan Note (Signed)
Has been doing relatively well right now with the headaches and everything.  We discussed with patient about different pillows, discussed which activities to do and which ones to avoid.  Patient has made ergonomic changes that has made significant improvement.  Increase activity slowly.  Follow-up with me again in 6 to 8 weeks

## 2023-04-18 NOTE — Patient Instructions (Signed)
Exercises Spenco Total Support Orthotics Get some other shoes for work Try pillow See me again in 2 months

## 2023-04-18 NOTE — Assessment & Plan Note (Signed)
Chronic, with worsening symptoms.  Patient did notice that it seemed to be associated with his shoes.  He is going to try to make changes at the moment.  Increase activity slowly.  Follow-up with me again in 6 to 8 weeks.  Worsening pain consider ultrasound and potential injections.

## 2023-04-23 ENCOUNTER — Encounter: Payer: Self-pay | Admitting: Family Medicine

## 2023-04-23 ENCOUNTER — Other Ambulatory Visit: Payer: Self-pay | Admitting: *Deleted

## 2023-04-23 DIAGNOSIS — J302 Other seasonal allergic rhinitis: Secondary | ICD-10-CM

## 2023-04-23 NOTE — Telephone Encounter (Signed)
Ok to refer to different ENT.  Katina Degree. Jimmey Ralph, MD 04/23/2023 11:12 AM

## 2023-04-23 NOTE — Telephone Encounter (Signed)
Please advise 

## 2023-04-23 NOTE — Telephone Encounter (Signed)
Referral placed.

## 2023-05-02 ENCOUNTER — Other Ambulatory Visit (HOSPITAL_COMMUNITY): Payer: Self-pay

## 2023-05-02 ENCOUNTER — Other Ambulatory Visit: Payer: Self-pay | Admitting: Family Medicine

## 2023-05-02 ENCOUNTER — Other Ambulatory Visit: Payer: Self-pay

## 2023-05-02 MED ORDER — LISDEXAMFETAMINE DIMESYLATE 40 MG PO CAPS
40.0000 mg | ORAL_CAPSULE | ORAL | 0 refills | Status: DC
  Filled 2023-05-02 (×2): qty 30, 30d supply, fill #0

## 2023-05-05 ENCOUNTER — Other Ambulatory Visit (HOSPITAL_COMMUNITY): Payer: Self-pay

## 2023-05-30 ENCOUNTER — Ambulatory Visit (INDEPENDENT_AMBULATORY_CARE_PROVIDER_SITE_OTHER): Payer: 59 | Admitting: Family Medicine

## 2023-05-30 ENCOUNTER — Encounter: Payer: Self-pay | Admitting: Family Medicine

## 2023-05-30 VITALS — BP 120/80 | HR 101 | Temp 97.7°F | Ht 70.0 in | Wt 197.5 lb

## 2023-05-30 DIAGNOSIS — Z13220 Encounter for screening for lipoid disorders: Secondary | ICD-10-CM | POA: Diagnosis not present

## 2023-05-30 DIAGNOSIS — Z131 Encounter for screening for diabetes mellitus: Secondary | ICD-10-CM

## 2023-05-30 DIAGNOSIS — F909 Attention-deficit hyperactivity disorder, unspecified type: Secondary | ICD-10-CM | POA: Diagnosis not present

## 2023-05-30 DIAGNOSIS — Z882 Allergy status to sulfonamides status: Secondary | ICD-10-CM

## 2023-05-30 DIAGNOSIS — E663 Overweight: Secondary | ICD-10-CM

## 2023-05-30 DIAGNOSIS — I10 Essential (primary) hypertension: Secondary | ICD-10-CM

## 2023-05-30 DIAGNOSIS — Z0000 Encounter for general adult medical examination without abnormal findings: Secondary | ICD-10-CM | POA: Diagnosis not present

## 2023-05-30 DIAGNOSIS — F4323 Adjustment disorder with mixed anxiety and depressed mood: Secondary | ICD-10-CM | POA: Diagnosis not present

## 2023-05-30 DIAGNOSIS — Z0001 Encounter for general adult medical examination with abnormal findings: Secondary | ICD-10-CM

## 2023-05-30 DIAGNOSIS — Z8042 Family history of malignant neoplasm of prostate: Secondary | ICD-10-CM

## 2023-05-30 DIAGNOSIS — Z880 Allergy status to penicillin: Secondary | ICD-10-CM | POA: Diagnosis not present

## 2023-05-30 DIAGNOSIS — J302 Other seasonal allergic rhinitis: Secondary | ICD-10-CM | POA: Diagnosis not present

## 2023-05-30 LAB — CBC WITH DIFFERENTIAL/PLATELET
Basophils Absolute: 0.1 10*3/uL (ref 0.0–0.1)
Basophils Relative: 0.9 % (ref 0.0–3.0)
Eosinophils Absolute: 0.1 10*3/uL (ref 0.0–0.7)
Eosinophils Relative: 1.9 % (ref 0.0–5.0)
HCT: 44.4 % (ref 39.0–52.0)
Hemoglobin: 14.7 g/dL (ref 13.0–17.0)
Lymphocytes Relative: 30.2 % (ref 12.0–46.0)
Lymphs Abs: 1.8 10*3/uL (ref 0.7–4.0)
MCHC: 33.2 g/dL (ref 30.0–36.0)
MCV: 87.9 fl (ref 78.0–100.0)
Monocytes Absolute: 0.6 10*3/uL (ref 0.1–1.0)
Monocytes Relative: 9.6 % (ref 3.0–12.0)
Neutro Abs: 3.4 10*3/uL (ref 1.4–7.7)
Neutrophils Relative %: 57.4 % (ref 43.0–77.0)
Platelets: 282 10*3/uL (ref 150.0–400.0)
RBC: 5.05 Mil/uL (ref 4.22–5.81)
RDW: 13.1 % (ref 11.5–15.5)
WBC: 6 10*3/uL (ref 4.0–10.5)

## 2023-05-30 LAB — COMPREHENSIVE METABOLIC PANEL
ALT: 28 U/L (ref 0–53)
AST: 19 U/L (ref 0–37)
Albumin: 4.7 g/dL (ref 3.5–5.2)
Alkaline Phosphatase: 51 U/L (ref 39–117)
BUN: 21 mg/dL (ref 6–23)
CO2: 29 mEq/L (ref 19–32)
Calcium: 9.6 mg/dL (ref 8.4–10.5)
Chloride: 105 mEq/L (ref 96–112)
Creatinine, Ser: 1.2 mg/dL (ref 0.40–1.50)
GFR: 76.08 mL/min (ref >60.00)
Glucose, Bld: 97 mg/dL (ref 70–99)
Potassium: 4.2 mEq/L (ref 3.5–5.1)
Sodium: 141 mEq/L (ref 135–145)
Total Bilirubin: 1.3 mg/dL — ABNORMAL HIGH (ref 0.2–1.2)
Total Protein: 6.8 g/dL (ref 6.0–8.3)

## 2023-05-30 LAB — LIPID PANEL
Cholesterol: 184 mg/dL (ref 0–200)
HDL: 48.1 mg/dL (ref >39.00)
LDL Cholesterol: 119 mg/dL — ABNORMAL HIGH (ref 0–99)
NonHDL: 135.82
Total CHOL/HDL Ratio: 4
Triglycerides: 86 mg/dL (ref 0.0–149.0)
VLDL: 17.2 mg/dL (ref 0.0–40.0)

## 2023-05-30 LAB — PSA: PSA: 0.93 ng/mL (ref 0.10–4.00)

## 2023-05-30 LAB — TSH: TSH: 1.78 u[IU]/mL (ref 0.35–5.50)

## 2023-05-30 LAB — HEMOGLOBIN A1C: Hgb A1c MFr Bld: 5.7 % (ref 4.6–6.5)

## 2023-05-30 MED ORDER — WEGOVY 0.25 MG/0.5ML ~~LOC~~ SOAJ: 0.2500 mg | SUBCUTANEOUS | 0 refills | Status: DC

## 2023-05-30 NOTE — Assessment & Plan Note (Signed)
BMI 28 with comorbidities including essential hypertension.  He is working on diet and exercise but has not had much improvement with weight.  Would be candidate for medical weight management.  We discussed treatment options.  He is interested in starting semaglutide.  Wife was recently started on this as well.  We will send this into his pharmacy.  He is aware of potential side effects.  He will follow-up with me in a few weeks and we can titrate the dose as needed.

## 2023-05-30 NOTE — Progress Notes (Signed)
Chief Complaint:  Jason [name] is a 42 y.o. male who presents today for his annual comprehensive physical exam.    Assessment/Plan:  Chronic Problems Addressed Today: Essential hypertension At goal losartan 25 mg daily.  Seasonal allergies He has been using nasal rinses at home and symptoms have been improving.  He was able to scan himself at work and saw evidence of deviated septum.  He does have upcoming appointment with ENT.  ADHD On Vyvanse 40 mg daily.  Having some dry mouth with this but otherwise seems to be tolerating well.  Medications do help with ability to stay focused on task.  Does not need refill today.  Follow-up in 3 to 6 months.  Overweight BMI 28 with comorbidities including essential hypertension.  He is working on diet and exercise but has not had much improvement with weight.  Would be candidate for medical weight management.  We discussed treatment options.  He is interested in starting semaglutide.  Wife was recently started on this as well.  We will send this into his pharmacy.  He is aware of potential side effects.  He will follow-up with me in a few weeks and we can titrate the dose as needed.  Family history of prostate cancer Check PSA.  Medication Allergy Patient with childhood history of allergy to penicillin and sulfa.  He would like to verify that this is a true allergy.  Will place referral to allergist.  Preventative Healthcare: Check labs.  Patient Counseling(The following topics were reviewed and/or handout was given):  -Nutrition: Stressed importance of moderation in sodium/caffeine intake, saturated fat and cholesterol, caloric balance, sufficient intake of fresh fruits, vegetables, and fiber.  -Stressed the importance of regular exercise.   -Substance Abuse: Discussed cessation/primary prevention of tobacco, alcohol, or other drug use; driving or other dangerous activities under the influence; availability of treatment for abuse.    -Injury prevention: Discussed safety belts, safety helmets, smoke detector, smoking near bedding or upholstery.   -Sexuality: Discussed sexually transmitted diseases, partner selection, use of condoms, avoidance of unintended pregnancy and contraceptive alternatives.   -Dental health: Discussed importance of regular tooth brushing, flossing, and dental visits.  -Health maintenance and immunizations reviewed. Please refer to Health maintenance section.  Return to care in 1 year for next preventative visit.     Subjective:  HPI:  He has no acute complaints today.  See A/P for status of chronic condition  Lifestyle Diet: Working on cutting down on sweets and carbohydrates.  Exercise: Limited recently due to orthopedic issues. Will be trying to get back into [employer].      05/30/2023    8:08 AM  Depression screen PHQ 2/9  Decreased Interest 0  Down, Depressed, Hopeless 0  PHQ - 2 Score 0  Altered sleeping 2  Tired, decreased energy 1  Change in appetite 1  Feeling bad or failure about yourself  0  Trouble concentrating 0  Moving slowly or fidgety/restless 0  Suicidal thoughts 0  PHQ-9 Score 4  Difficult doing work/chores Not difficult at all    There are no preventive care reminders to display for this patient.   ROS: Per HPI, otherwise a complete review of systems was negative.   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   Essential hypertension 05/16/2020   Seasonal  allergies 05/16/2020   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   Past Surgical History:  Procedure Laterality Date   ADENOIDECTOMY     SALIVARY GLAND SURGERY     TONSILLECTOMY     WISDOM TOOTH EXTRACTION      Family History  Problem Relation Age of Onset   Arthritis Mother    Hypertension Mother    Prostate cancer Father    Skin cancer Father    Cancer Father    Stroke Paternal Grandmother    Prostate cancer Paternal Grandfather    Stroke Paternal Grandfather    Parkinson's disease Paternal Grandfather    Prostate cancer Paternal Uncle     Medications- reviewed and updated Current Outpatient Medications  Medication Sig Dispense Refill   azelastine (ASTELIN) 0.1 % nasal spray Place 2 sprays into both nostrils 2 (two) times daily. 30 mL 12   lisdexamfetamine (VYVANSE) 40 MG capsule Take 1 capsule (40 mg total) by mouth every morning. 30 capsule 0   losartan (COZAAR) 25 MG tablet Take 1 tablet (25 mg total) by mouth daily. 90 tablet 3   Multiple Vitamin (MULTIVITAMIN ADULT PO) Take by mouth.     Semaglutide-Weight Management (WEGOVY) 0.25 MG/0.5ML SOAJ Inject 0.25 mg into the skin once a week. 2 mL 0   tiZANidine (ZANAFLEX) 4 MG tablet Take 1 tablet (4 mg total) by mouth every 6 (six) hours as needed for muscle spasms. 30 tablet 0   No current facility-administered medications for this visit.    Allergies-reviewed and updated Allergies  Allergen Reactions   Penicillins    Sulfa Antibiotics Other (See Comments)    Social History   Socioeconomic History   Marital status: Married    Spouse name: Not on file   Number of children: Not on file   Years of education: Not on file   Highest education level: Not on file  Occupational History   Occupation: Dentist  Tobacco Use   Smoking status: Never   Smokeless tobacco: Never  Vaping Use   Vaping Use: Never used  Substance and Sexual Activity   Alcohol use: Yes    Comment: Rarely   Drug use: Never   Sexual activity: Yes     Birth control/protection: None  Other Topics Concern   Not on file  Social History Narrative   Not on file   Social Determinants of Health   Financial Resource Strain: Not on file  Food Insecurity: Not on file  Transportation Needs: Not on file  Physical Activity: Not on file  Stress: Not on file  Social Connections: Not on file        Objective:  Physical Exam: BP 120/80 (BP Location: Left Arm, Patient Position: Sitting, Cuff Size: Large)   Pulse (!) 101   Temp 97.7 F (36.5 C) (Temporal)   Ht 5\' 10"  (1.778 m)   Wt 197 lb 8 oz (89.6 kg)   SpO2 98%   BMI 28.34 kg/m   Body mass index is 28.34 kg/m. Wt Readings from Last 3 Encounters:  05/30/23 197 lb 8 oz (89.6 kg)  02/14/23 200 lb (90.7 kg)  02/07/23 200 lb (90.7 kg)  Gen: NAD, resting comfortably HEENT: TMs normal bilaterally. OP clear. No thyromegaly noted.  CV: RRR with no murmurs appreciated Pulm: NWOB, CTAB with no crackles, wheezes, or rhonchi GI: Normal bowel sounds present. Soft, Nontender, Nondistended. MSK: no edema, cyanosis, or clubbing noted Skin: warm, dry Neuro: CN2-12 grossly intact. Strength 5/5 in upper and lower extremities. Reflexes symmetric and intact bilaterally.  Psych: Normal affect and thought content     Lucie Friedlander M. Jimmey Ralph, MD 05/30/2023 8:56 AM

## 2023-05-30 NOTE — Assessment & Plan Note (Signed)
He has been using nasal rinses at home and symptoms have been improving.  He was able to scan himself at work and saw evidence of deviated septum.  He does have upcoming appointment with ENT.

## 2023-05-30 NOTE — Assessment & Plan Note (Signed)
On Vyvanse 40 mg daily.  Having some dry mouth with this but otherwise seems to be tolerating well.  Medications do help with ability to stay focused on task.  Does not need refill today.  Follow-up in 3 to 6 months.

## 2023-05-30 NOTE — Patient Instructions (Addendum)
It was very nice to see you today!  We will check blood work today.  I will refer you to see an allergist.  We will try [facility]  Please send a message in a few weeks to let me know how you are doing and we can increase the dose as needed.  Return in about 6 months (around 11/29/2023) for Follow Up.   Take care, Dr Jimmey Ralph  PLEASE NOTE:  If you had any lab tests, please let us know if you have not heard back within a few days. You may see your results on mychart before we have a chance to review them but we will give you a call once they are reviewed by [place].   If we ordered any referrals today, please let us know if you have not heard from their office within the next week.   If you had any urgent prescriptions sent in today, please check with the pharmacy within an hour of our visit to make sure the prescription was transmitted appropriately.   Please try these tips to maintain a healthy lifestyle:  Eat at least 3 REAL meals and 1-2 snacks per day.  Aim for no more than 5 hours between eating.  If you eat breakfast, please do so within one hour of getting up.   Each meal should contain half fruits/vegetables, one quarter protein, and one quarter carbs (no bigger than a computer mouse)  Cut down on sweet beverages. This includes juice, soda, and sweet tea.   Drink at least 1 glass of water with each meal and aim for at least 8 glasses per day  Exercise at least 150 minutes every week.    Preventive Care 69-7 Years Old, Male Preventive care refers to lifestyle choices and visits with your health care provider that can promote health and wellness. Preventive care visits are also called wellness exams. What can I expect for my preventive care visit? Counseling During your preventive care visit, your health care provider may ask about your: Medical history, including: Past medical problems. Family medical history. Current health, including: Emotional well-being. Home life and  relationship well-being. Sexual activity. Lifestyle, including: Alcohol, nicotine or tobacco, and drug use. Access to firearms. Diet, exercise, and sleep habits. Safety issues such as seatbelt and bike helmet use. Sunscreen use. Work and work Astronomer. Physical exam Your health care provider may check your: Height and weight. These may be used to calculate your BMI (body mass index). BMI is a measurement that tells if you are at a healthy weight. Waist circumference. This measures the distance around your waistline. This measurement also tells if you are at a healthy weight and may help predict your risk of certain diseases, such as type 2 diabetes and high blood pressure. Heart rate and blood pressure. Body temperature. Skin for abnormal spots. What immunizations do I need?  Vaccines are usually given at various ages, according to a schedule. Your health care provider will recommend vaccines for you based on your age, medical history, and lifestyle or other factors, such as travel or where you work. What tests do I need? Screening Your health care provider may recommend screening tests for certain conditions. This may include: Lipid and cholesterol levels. Diabetes screening. This is done by checking your blood sugar (glucose) after you have not eaten for a while (fasting). Hepatitis B test. Hepatitis C test. HIV (human immunodeficiency virus) test. STI (sexually transmitted infection) testing, if you are at risk. Talk with your health care provider  about your test results, treatment options, and if necessary, the need for more tests. Follow these instructions at home: Eating and drinking  Eat a healthy diet that includes fresh fruits and vegetables, whole grains, lean protein, and low-fat dairy products. Drink enough fluid to keep your urine pale yellow. Take vitamin and mineral supplements as recommended by your health care provider. Do not drink alcohol if your health care  provider tells you not to drink. If you drink alcohol: Limit how much you have to 0-2 drinks a day. Know how much alcohol is in your drink. In the [place] one drink equals one 12 oz bottle of beer (355 mL), one 5 oz glass of wine (148 mL), or one 1 oz glass of hard liquor (44 mL). Lifestyle Brush your teeth every morning and night with fluoride toothpaste. Floss one time each day. Exercise for at least 30 minutes 5 or more days each week. Do not use any products that contain nicotine or tobacco. These products include cigarettes, chewing tobacco, and vaping devices, such as e-cigarettes. If you need help quitting, ask your health care provider. Do not use drugs. If you are sexually active, practice safe sex. Use a condom or other form of protection to prevent STIs. Find healthy ways to manage stress, such as: Meditation, yoga, or listening to music. Journaling. Talking to a trusted person. Spending time with friends and family. Minimize exposure to UV radiation to reduce your risk of skin cancer. Safety Always wear your seat belt while driving or riding in a vehicle. Do not drive: If you have been drinking alcohol. Do not ride with someone who has been drinking. If you have been using any mind-altering substances or drugs. While texting. When you are tired or distracted. Wear a helmet and other protective equipment during sports activities. If you have firearms in your house, make sure you follow all gun safety procedures. Seek help if you have been physically or sexually abused. What's next? Go to your health care provider once a year for an annual wellness visit. Ask your health care provider how often you should have your eyes and teeth checked. Stay up to date on all vaccines. This information is not intended to replace advice given to you by your health care provider. Make sure you discuss any questions you have with your health care provider. Document Revised: 05/16/2021 Document  Reviewed: 05/16/2021 Elsevier Patient Education  2024 [employer].

## 2023-05-30 NOTE — Assessment & Plan Note (Signed)
Check PSA. ?

## 2023-05-30 NOTE — Telephone Encounter (Signed)
Oral semaglutide exists and we will prescribe this as Rybelsus however this is only really effective for diabetes.  I am not familiar with a buccal mucosa absorption formulation and would recommend against this for now.  We can send it over to [facility] instead if he is ok with that.  Katina Degree. Jimmey Ralph, MD 05/30/2023 12:29 PM

## 2023-05-30 NOTE — Assessment & Plan Note (Signed)
At goal losartan 25 mg daily.

## 2023-06-03 NOTE — Progress Notes (Signed)
His LDL is up a little bit since last year but the rest of his labs are all stable.  He should continue to work on diet and exercise and we can recheck everything in a year.

## 2023-06-04 NOTE — Telephone Encounter (Signed)
See note

## 2023-06-09 MED ORDER — SEMAGLUTIDE(0.25 OR 0.5MG/DOS) 2 MG/1.5ML ~~LOC~~ SOPN: 0.2500 mg | PEN_INJECTOR | SUBCUTANEOUS | 0 refills | Status: DC

## 2023-06-09 NOTE — Telephone Encounter (Signed)
Rx faxed to [phone] [facility]

## 2023-06-09 NOTE — Progress Notes (Signed)
[name] [address] [place] [address] Phone: [phone] Subjective:   Wynema Birch, am serving as a scribe for Dr. Antoine Primas.  I'm seeing this patient by the request  of:  [name], MD  CC: Bilateral ankle left greater than right  GLO:[id number]  04/18/2023 Chronic, with worsening symptoms.  Patient did notice that it seemed to be associated with his shoes.  He is going to try to make changes at the moment.  Increase activity slowly.  Follow-up with me again in 6 to 8 weeks.  Worsening pain consider ultrasound and potential injections.     Update 06/13/2023 ULIS KAPS is a 42 y.o. male coming in with complaint of B ankle pain. OMT last visit as well. Patient states that the left ankle he has had a couple of times when he would reach out and down to pick something up he would feel like a tendon "moved" and he would get a quick sharp pain that would go away very quick then would be dull and he wanted to know what that could be.      Past Medical History:  Diagnosis Date   Allergy    Hypertension    Migraine    Past Surgical History:  Procedure Laterality Date   ADENOIDECTOMY     SALIVARY GLAND SURGERY     TONSILLECTOMY     WISDOM TOOTH EXTRACTION     Social History   Socioeconomic History   Marital status: Married    Spouse name: Not on file   Number of children: Not on file   Years of education: Not on file   Highest education level: Not on file  Occupational History   Occupation: Dentist  Tobacco Use   Smoking status: Never   Smokeless tobacco: Never  Vaping Use   Vaping status: Never Used  Substance and Sexual Activity   Alcohol use: Yes    Comment: Rarely   Drug use: Never   Sexual activity: Yes    Birth control/protection: None  Other Topics Concern   Not on file  Social History Narrative   Not on file   Social Determinants of Health   Financial Resource Strain: Not on file  Food  Insecurity: Not on file  Transportation Needs: Not on file  Physical Activity: Not on file  Stress: Not on file  Social Connections: Not on file   Allergies  Allergen Reactions   Penicillins    Sulfa Antibiotics Other (See Comments)   Family History  Problem Relation Age of Onset   Arthritis Mother    Hypertension Mother    Prostate cancer Father    Skin cancer Father    Cancer Father    Stroke Paternal Grandmother    Prostate cancer Paternal Grandfather    Stroke Paternal Grandfather    Parkinson's disease Paternal Grandfather    Prostate cancer Paternal Uncle     Current Outpatient Medications (Endocrine & Metabolic):    Semaglutide,0.25 or 0.5MG /DOS, 2 MG/1.5ML SOPN, Inject 0.25-0.5 mg into the skin once a week. Take 0.25 mg weekly for 4 weeks then increase to 0.5 mg weekly (Patient not taking: Reported on 06/13/2023)  Current Outpatient Medications (Cardiovascular):    losartan (COZAAR) 25 MG tablet, Take 1 tablet (25 mg total) by mouth daily.  Current Outpatient Medications (Respiratory):    azelastine (ASTELIN) 0.1 % nasal spray, Place 2 sprays into both nostrils 2 (two) times daily.    Current Outpatient  Medications (Other):    lisdexamfetamine (VYVANSE) 40 MG capsule, Take 1 capsule (40 mg total) by mouth every morning.   Multiple Vitamin (MULTIVITAMIN ADULT PO), Take by mouth.   tiZANidine (ZANAFLEX) 4 MG tablet, Take 1 tablet (4 mg total) by mouth every 6 (six) hours as needed for muscle spasms.   Semaglutide-Weight Management (WEGOVY) 0.25 MG/0.5ML SOAJ, Inject 0.25 mg into the skin once a week. (Patient not taking: Reported on 06/13/2023)   Reviewed prior external information including notes and imaging from  primary care provider As well as notes that were available from care everywhere and other healthcare systems.  Past medical history, social, surgical and family history all reviewed in electronic medical record.  No pertanent information unless stated  regarding to the chief complaint.   Review of Systems:  No  visual changes, nausea, vomiting, diarrhea, constipation, dizziness, abdominal pain, skin rash, fevers, chills, night sweats, weight loss, swollen lymph nodes, body aches, joint swelling, chest pain, shortness of breath, mood changes. POSITIVE muscle aches, headache  Objective  Blood pressure 124/84, pulse 77, height 5\' 10"  (1.778 m), weight 199 lb (90.3 kg), SpO2 98%.   General: No apparent distress alert and oriented x3 mood and affect normal, dressed appropriately.  HEENT: Pupils equal, extraocular movements intact  Respiratory: Patient's speak in full sentences and does not appear short of breath  Cardiovascular: No lower extremity edema, non tender, no erythema  Neck exam still shows tightness noted in the occipital area.  Patient's left thumb does have some tenderness to palpation but negative grind test noted.   Osteopathic findings C2 flexed rotated and side bent right C3 flexed rotated and side bent right C6 flexed rotated and side bent left T3 extended rotated and side bent right inhaled third rib T7 extended rotated and side bent left L1 flexed rotated and side bent right L3 flexed rotated and side bent left  sacrum right on right    Impression and Recommendations:     The above documentation has been reviewed and is accurate and complete Judi Saa, DO

## 2023-06-12 ENCOUNTER — Telehealth: Payer: Self-pay | Admitting: Family Medicine

## 2023-06-12 NOTE — Telephone Encounter (Signed)
Caller is [place] from [employer]. States the semaglutide that was sent in on 7/8 does not have the title of their formulation that they have. They are requesting clarification of if they could use their formulation or not. Formulation can be seen below.    2.5 mg per mL with pyridoxine 10 mg per mL.   [place] can be reached at [phone].

## 2023-06-13 ENCOUNTER — Encounter: Payer: Self-pay | Admitting: Family Medicine

## 2023-06-13 ENCOUNTER — Ambulatory Visit (INDEPENDENT_AMBULATORY_CARE_PROVIDER_SITE_OTHER): Payer: 59 | Admitting: Family Medicine

## 2023-06-13 VITALS — BP 124/84 | HR 77 | Ht 70.0 in | Wt 199.0 lb

## 2023-06-13 DIAGNOSIS — M7672 Peroneal tendinitis, left leg: Secondary | ICD-10-CM

## 2023-06-13 DIAGNOSIS — M9908 Segmental and somatic dysfunction of rib cage: Secondary | ICD-10-CM | POA: Diagnosis not present

## 2023-06-13 DIAGNOSIS — M9901 Segmental and somatic dysfunction of cervical region: Secondary | ICD-10-CM | POA: Diagnosis not present

## 2023-06-13 DIAGNOSIS — G8929 Other chronic pain: Secondary | ICD-10-CM | POA: Insufficient documentation

## 2023-06-13 DIAGNOSIS — M9903 Segmental and somatic dysfunction of lumbar region: Secondary | ICD-10-CM | POA: Diagnosis not present

## 2023-06-13 DIAGNOSIS — M79645 Pain in left finger(s): Secondary | ICD-10-CM

## 2023-06-13 DIAGNOSIS — M9904 Segmental and somatic dysfunction of sacral region: Secondary | ICD-10-CM | POA: Diagnosis not present

## 2023-06-13 DIAGNOSIS — M9902 Segmental and somatic dysfunction of thoracic region: Secondary | ICD-10-CM | POA: Diagnosis not present

## 2023-06-13 DIAGNOSIS — G43809 Other migraine, not intractable, without status migrainosus: Secondary | ICD-10-CM

## 2023-06-13 NOTE — Assessment & Plan Note (Signed)
Continue assessment   Decision today to treat with OMT was based on Physical Exam  After verbal consent patient was treated with HVLA, ME, FPR techniques in cervical, thoracic, rib, lumbar and sacral areas, all areas are chronic   Patient tolerated the procedure well with improvement in symptoms  Patient given exercises, stretches and lifestyle modifications  See medications in patient instructions if given  Patient will follow up in 4-8 weeks

## 2023-06-13 NOTE — Telephone Encounter (Signed)
Ok with me. Please place any necessary orders. 

## 2023-06-13 NOTE — Patient Instructions (Addendum)
Good to see you  Exercises given Shoes with a little bit of a heel when standing Have a great time in [place] Follow up in 6-8 weeks

## 2023-06-13 NOTE — Telephone Encounter (Signed)
Please advise 

## 2023-06-13 NOTE — Assessment & Plan Note (Signed)
Discussed home exercises, icing regimen, which activities to do and which ones to avoid.  Discussed heel lifts in the shoes.  Discussed avoiding standing more patient does have a supination of the heels when possible.  Follow-up again in 6 to 8 weeks otherwise.

## 2023-06-13 NOTE — Assessment & Plan Note (Signed)
Likely secondary to overuse.  With patient doing a lot of repetitive aspect of the tenderness we are concerned that there could be some early and potential arthritic changes.  Will have patient wear thumb spica at night.  Differential includes tendinitis.  Increase activity slowly.  Follow-up again in 6 to 8 weeks.

## 2023-06-13 NOTE — Assessment & Plan Note (Signed)
Has been making remarkable to the course of time.  Discussed icing regimen and home exercises, discussed which activities to do and which ones to avoid.  Patient will be traveling.  Discussed with her ergonomics that I think will be potentially beneficial as well.  Follow-up again in 6 to 8 weeks.

## 2023-06-16 ENCOUNTER — Other Ambulatory Visit: Payer: Self-pay | Admitting: Family Medicine

## 2023-06-16 ENCOUNTER — Other Ambulatory Visit (HOSPITAL_COMMUNITY): Payer: Self-pay

## 2023-06-16 MED ORDER — LISDEXAMFETAMINE DIMESYLATE 40 MG PO CAPS
40.0000 mg | ORAL_CAPSULE | ORAL | 0 refills | Status: DC
  Filled 2023-06-16: qty 30, 30d supply, fill #0

## 2023-06-16 NOTE — Telephone Encounter (Signed)
Please advise 

## 2023-06-16 NOTE — Telephone Encounter (Signed)
I believe this was sent in last week. CAn we check to make sure?  Katina Degree. Jimmey Ralph, MD 06/16/2023 12:13 PM

## 2023-06-17 NOTE — Telephone Encounter (Signed)
Spoke with pharmacy VO given 2.5 mg per mL with pyridoxine 10 mg per mL.

## 2023-06-18 ENCOUNTER — Other Ambulatory Visit (HOSPITAL_COMMUNITY): Payer: Self-pay

## 2023-06-28 ENCOUNTER — Encounter: Payer: Self-pay | Admitting: Family Medicine

## 2023-06-30 ENCOUNTER — Other Ambulatory Visit: Payer: Self-pay | Admitting: Physician Assistant

## 2023-06-30 ENCOUNTER — Other Ambulatory Visit (HOSPITAL_COMMUNITY): Payer: Self-pay

## 2023-06-30 ENCOUNTER — Encounter: Payer: Self-pay | Admitting: Family Medicine

## 2023-06-30 MED ORDER — LOSARTAN POTASSIUM 25 MG PO TABS
25.0000 mg | ORAL_TABLET | Freq: Every day | ORAL | 3 refills | Status: DC
  Filled 2023-06-30: qty 90, 90d supply, fill #0
  Filled 2023-11-02: qty 90, 90d supply, fill #1

## 2023-06-30 NOTE — Telephone Encounter (Signed)
Ok to write letter stating that he is under our care as his PCP and that he is currently prescribed Vyvanse 40 mg daily.  He should be permitted to carry 7 doses of the medication with him for his trip to the [place] from 07/02/23 to 07/08/23.  Katina Degree. Jimmey Ralph, MD 06/30/2023 10:14 AM

## 2023-06-30 NOTE — Telephone Encounter (Signed)
See note

## 2023-06-30 NOTE — Telephone Encounter (Signed)
LVM to patient, Letter printed and placed at front office also it was send via [employer]

## 2023-07-01 IMAGING — DX DG LUMBAR SPINE 2-3V
3 series · 3 of 3 positions shown · non-contrast
Comparison: None.

CLINICAL DATA: Posterior neck and back pain, headaches

EXAM:
LUMBAR SPINE - 2-3 VIEW

[l-spine ap]
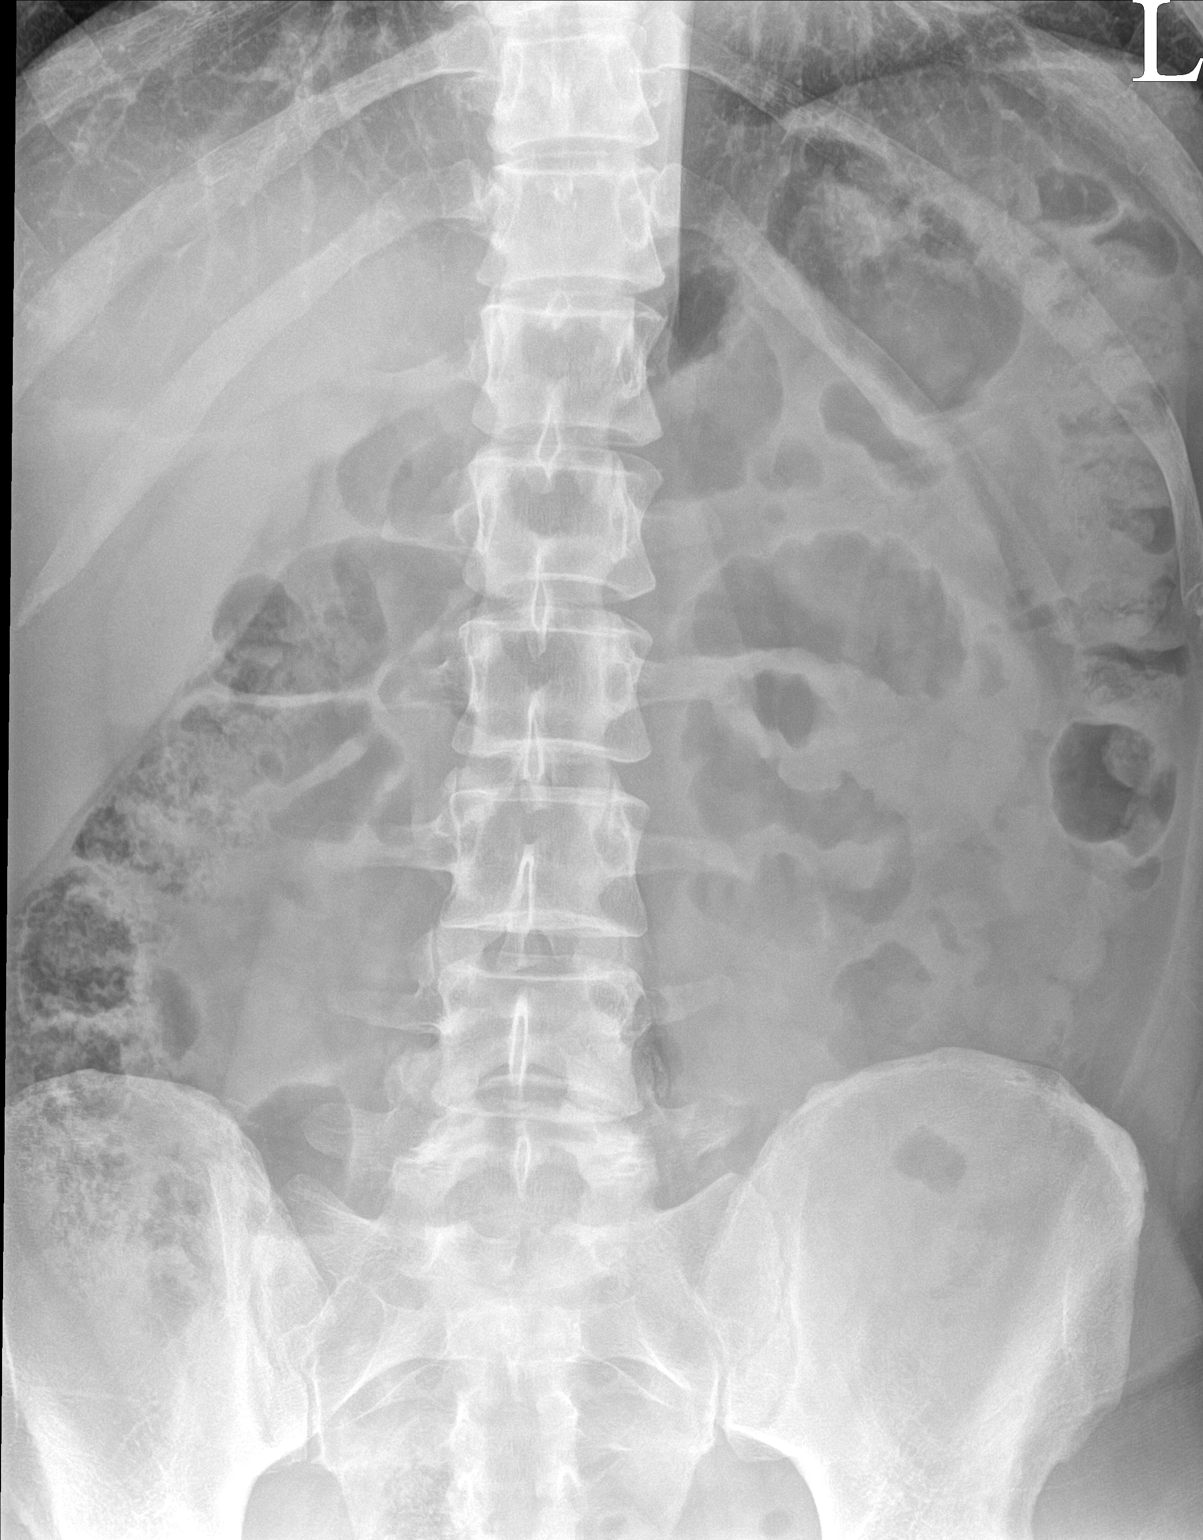

[l-spine lateral (1 of 2)]
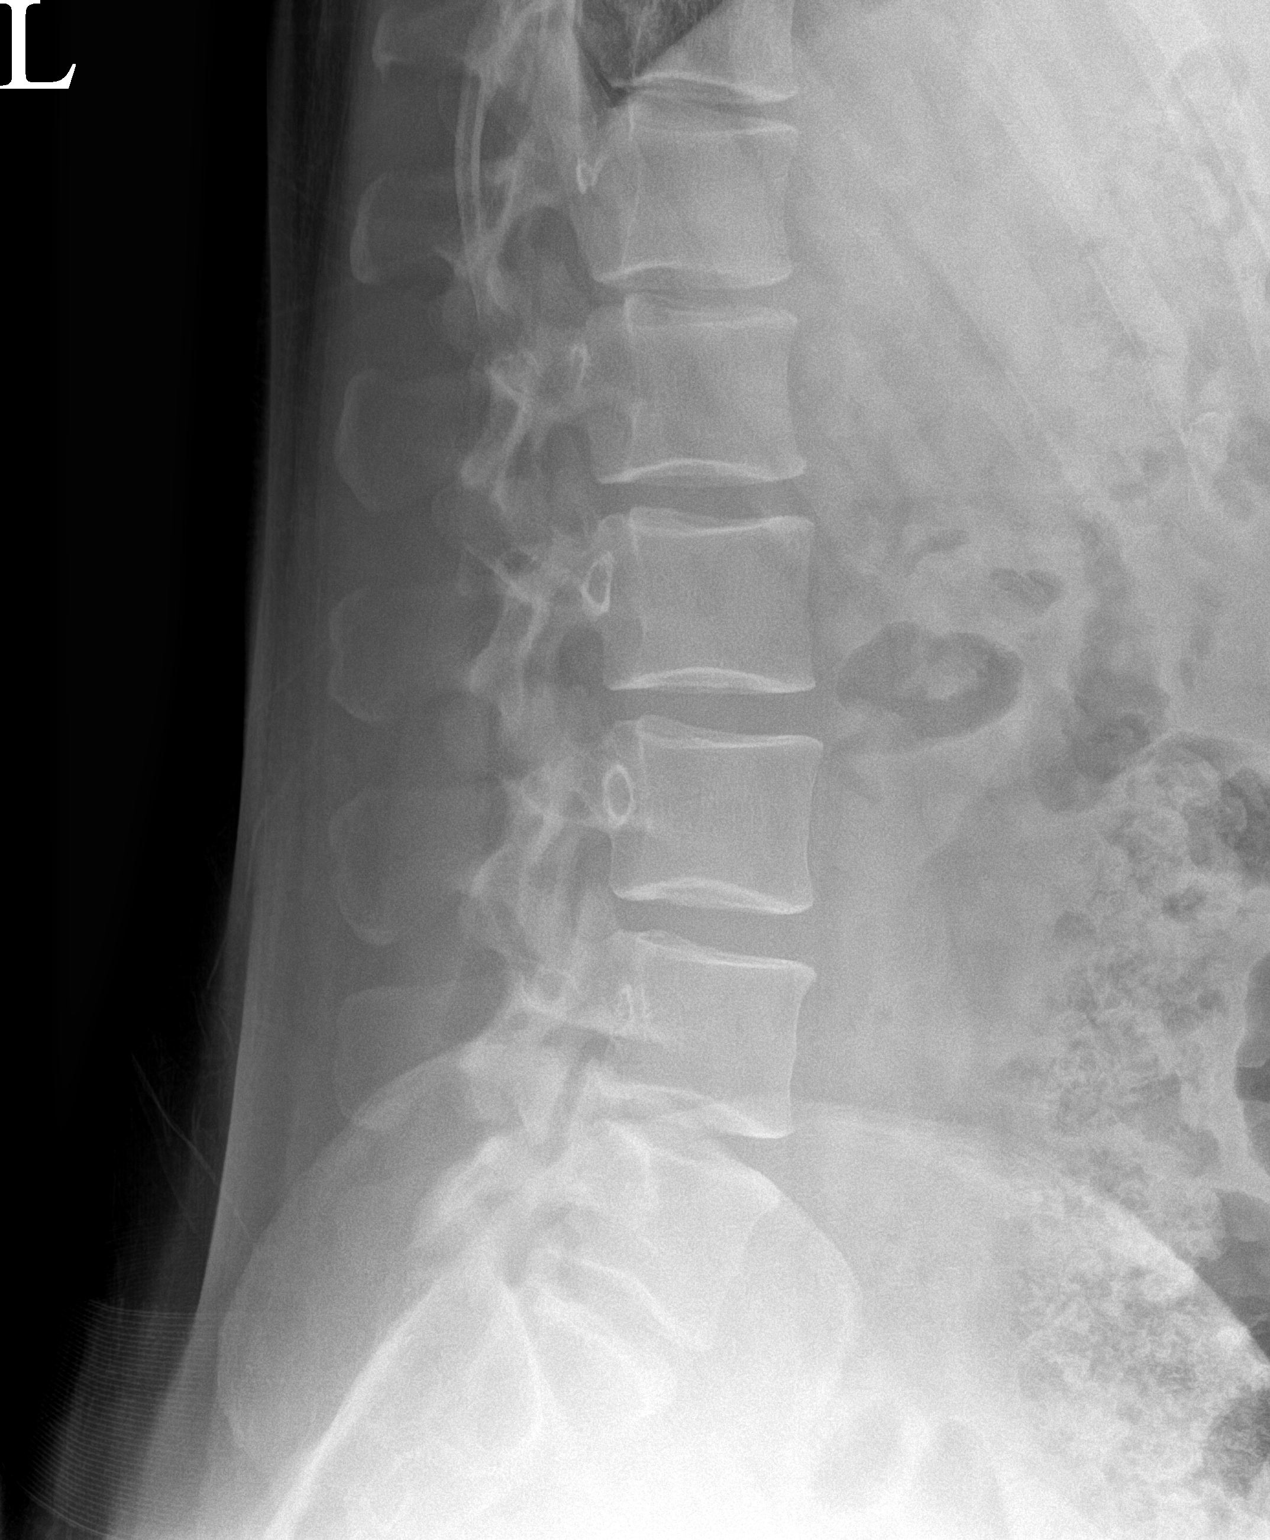

[l-spine lateral (2 of 2)]
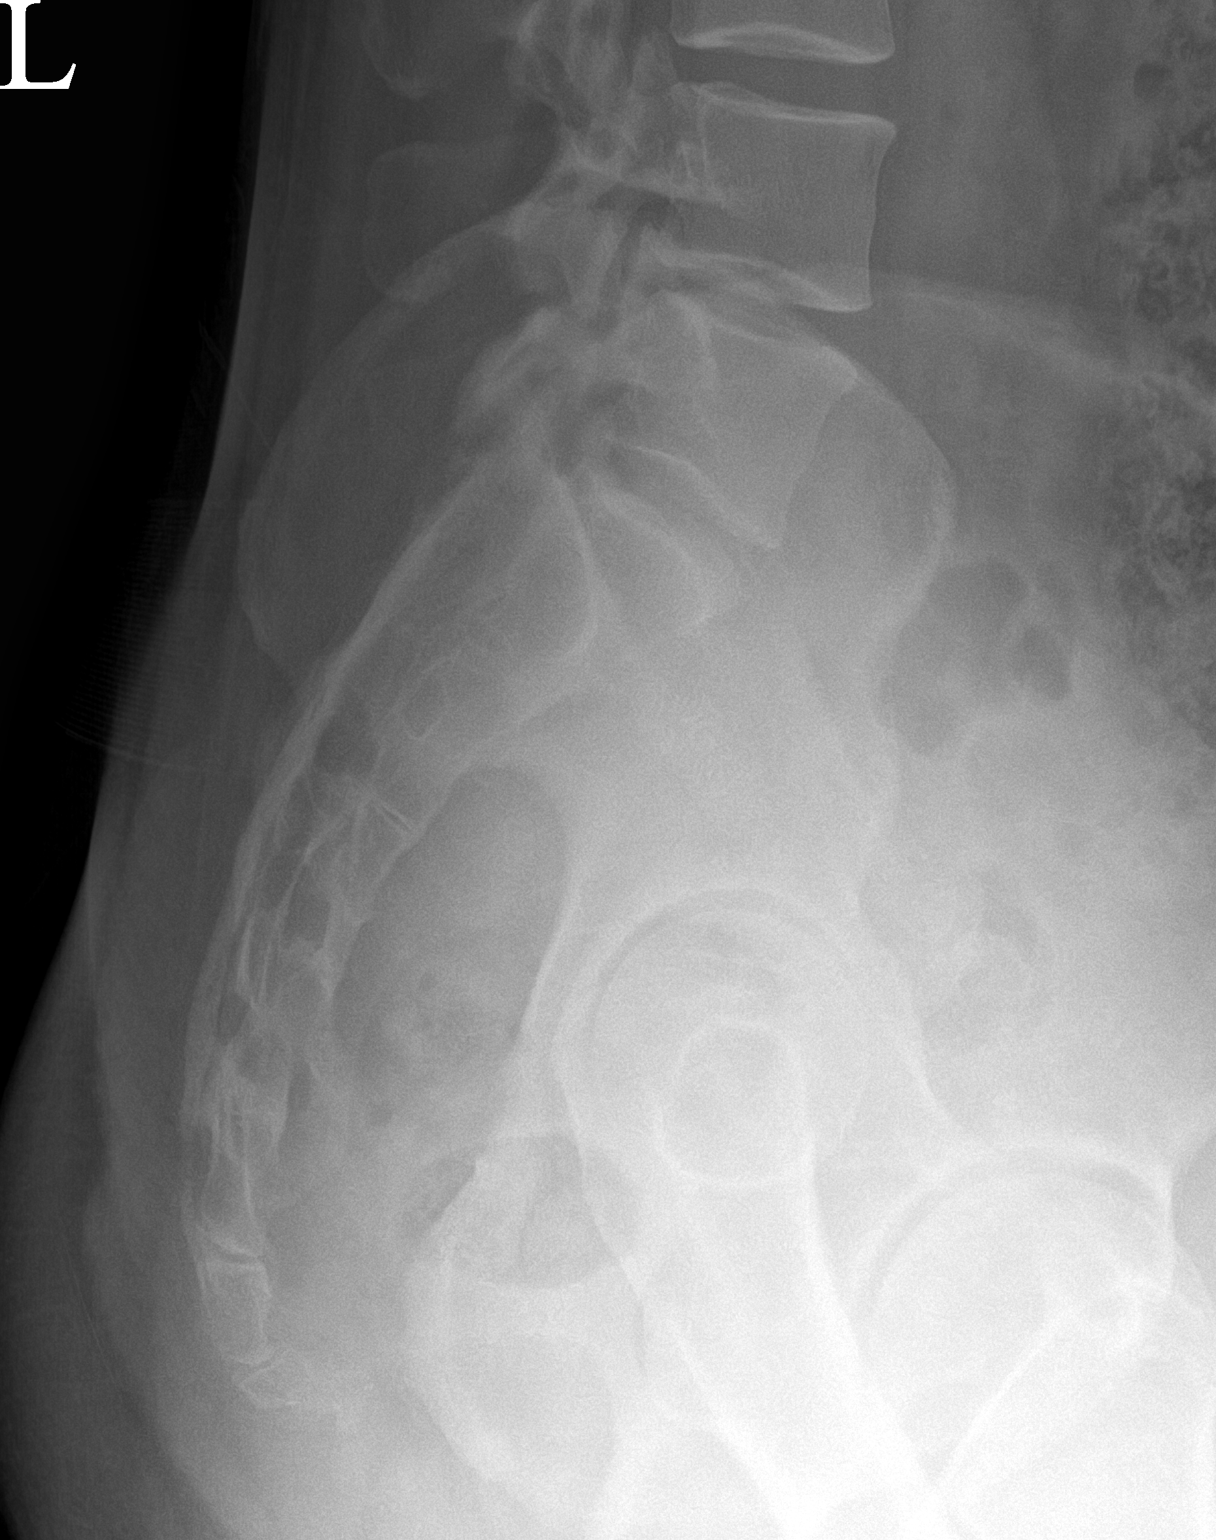

[3 of 3 positions shown; findings below may reference images not displayed]

FINDINGS: Rudimentary ribs at T12. Five otherwise normal lumbar type
rib-bearing vertebral bodies. No suspicious lytic or blastic
lesions. No acute fracture or traumatic listhesis. Minimal disc
height loss and discogenic changes L4-5, L5-S1. Remaining disc
spaces are relatively well preserved
IMPRESSION: Acute or worrisome osseous abnormalities. Minimal discogenic changes
L4-5, L5-S1.

## 2023-07-24 NOTE — Progress Notes (Signed)
[name] [address] [place] [address] Phone: [phone] Subjective:   Jason [name], am serving as a scribe for Dr. Antoine Primas.  I'm seeing this patient by the request  of:  [name], MD  CC: Neck pain and headache follow-up  [id number]  JAISEAN KUSNIERZ is a 42 y.o. male coming in for OMT 06/13/2023. Patient states that walking around [place] his feet were painful. Wants to know what to put in his shoe. Bought [employer]en[employer] and is only using them in R shoe. Pain will be in arch, heel and into the ankle.   Back did ok with traveling. Did get COVID and feels that his lower back pain increased and he is having some lower left sided back pain.   Taking: none         Reviewed prior external information including notes and imaging from previsou exam, outside providers and external EMR if available.   As well as notes that were available from care everywhere and other healthcare systems.  Past medical history, social, surgical and family history all reviewed in electronic medical record.  No pertanent information unless stated regarding to the chief complaint.   Past Medical History:  Diagnosis Date   Allergy    Hypertension    Migraine     Allergies  Allergen Reactions   Penicillins    Sulfa Antibiotics Other (See Comments)     Review of Systems:  No  visual changes, nausea, vomiting, diarrhea, constipation, dizziness, abdominal pain, skin rash, fevers, chills, night sweats, weight loss, swollen lymph nodes, body aches, joint swelling, chest pain, shortness of breath, mood changes. POSITIVE muscle aches, headache  Objective  Blood pressure (!) 122/92, pulse 69, height 5\' 10"  (1.778 m), weight 194 lb (88 kg), SpO2 97%.   General: No apparent distress alert and oriented x3 mood and affect normal, dressed appropriately.  HEENT: Pupils equal, extraocular movements intact  Respiratory: Patient's speak in full sentences and  does not appear short of breath  Cardiovascular: No lower extremity edema, non tender, no erythema  Gait normal MSK:  Back  Neck exam shows patient does have some tightness noted more on the right occipital region as well as on the right lower cervical spine as well.  Some mild lymph nodes noted on the right side right ankle exam does show the patient does have some overpronation of the hindfoot noted.  Some tenderness to palpation over the tarsal tunnel and the posterior tibialis tendon.  Neurovascular intact distally.  Osteopathic findings  C2 flexed rotated and side bent right C6 flexed rotated and side bent right T5 extended rotated and side bent right inhaled rib L2 flexed rotated and side bent right L5 flexed rotated and side bent left Sacrum right on right       Assessment and Plan:  Cervicogenic migraine Patient is doing relatively well.  Working on his weight loss as well.  Goal weight of 1 75-185.  Discussed with patient to continue to work on core strength.  Patient does respond extremely well to osteopathic manipulation though otherwise.  Follow-up with me again in 6 to 8 weeks.  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  Patient has failed over-the-counter orthotics, home exercises, icing regimen and do feel that custom orthotics would be beneficial to help support the longitudinal arch and the fatigue that occurs that causes more of the  posterior tibialis tendinitis.  Referred for custom orthotics today.    Nonallopathic problems  Decision today to treat with OMT was based on Physical Exam  After verbal consent patient was treated with HVLA, ME, FPR techniques in cervical, rib, thoracic, lumbar, and sacral  areas  Patient tolerated the procedure well with improvement in symptoms  Patient given exercises, stretches and lifestyle modifications  See medications in patient  instructions if given  Patient will follow up in 4-8 weeks     The above documentation has been reviewed and is accurate and complete Judi Saa, DO         Note: This dictation was prepared with Dragon dictation along with smaller phrase technology. Any transcriptional errors that result from this process are unintentional.

## 2023-07-25 ENCOUNTER — Encounter: Payer: Self-pay | Admitting: Family Medicine

## 2023-07-25 ENCOUNTER — Ambulatory Visit (INDEPENDENT_AMBULATORY_CARE_PROVIDER_SITE_OTHER): Payer: 59 | Admitting: Family Medicine

## 2023-07-25 VITALS — BP 122/92 | HR 69 | Ht 70.0 in | Wt 194.0 lb

## 2023-07-25 DIAGNOSIS — J309 Allergic rhinitis, unspecified: Secondary | ICD-10-CM | POA: Diagnosis not present

## 2023-07-25 DIAGNOSIS — M9903 Segmental and somatic dysfunction of lumbar region: Secondary | ICD-10-CM | POA: Diagnosis not present

## 2023-07-25 DIAGNOSIS — M950 Acquired deformity of nose: Secondary | ICD-10-CM | POA: Diagnosis not present

## 2023-07-25 DIAGNOSIS — M9908 Segmental and somatic dysfunction of rib cage: Secondary | ICD-10-CM | POA: Diagnosis not present

## 2023-07-25 DIAGNOSIS — M9901 Segmental and somatic dysfunction of cervical region: Secondary | ICD-10-CM

## 2023-07-25 DIAGNOSIS — M79672 Pain in left foot: Secondary | ICD-10-CM

## 2023-07-25 DIAGNOSIS — M76822 Posterior tibial tendinitis, left leg: Secondary | ICD-10-CM | POA: Diagnosis not present

## 2023-07-25 DIAGNOSIS — J342 Deviated nasal septum: Secondary | ICD-10-CM | POA: Diagnosis not present

## 2023-07-25 DIAGNOSIS — M76821 Posterior tibial tendinitis, right leg: Secondary | ICD-10-CM

## 2023-07-25 DIAGNOSIS — M9902 Segmental and somatic dysfunction of thoracic region: Secondary | ICD-10-CM | POA: Diagnosis not present

## 2023-07-25 DIAGNOSIS — R0683 Snoring: Secondary | ICD-10-CM | POA: Diagnosis not present

## 2023-07-25 DIAGNOSIS — M9904 Segmental and somatic dysfunction of sacral region: Secondary | ICD-10-CM | POA: Diagnosis not present

## 2023-07-25 DIAGNOSIS — M79671 Pain in right foot: Secondary | ICD-10-CM | POA: Diagnosis not present

## 2023-07-25 DIAGNOSIS — G43809 Other migraine, not intractable, without status migrainosus: Secondary | ICD-10-CM | POA: Diagnosis not present

## 2023-07-25 NOTE — Assessment & Plan Note (Signed)
Patient is doing relatively well.  Working on his weight loss as well.  Goal weight of 1 75-185.  Discussed with patient to continue to work on core strength.  Patient does respond extremely well to osteopathic manipulation though otherwise.  Follow-up with me again in 6 to 8 weeks.

## 2023-07-25 NOTE — Assessment & Plan Note (Signed)
Worsening at this point and did affect him while he was on his travels.  Patient has failed over-the-counter orthotics, home exercises, icing regimen and do feel that custom orthotics would be beneficial to help support the longitudinal arch and the fatigue that occurs that causes more of the posterior tibialis tendinitis.  Referred for custom orthotics today.

## 2023-07-25 NOTE — Patient Instructions (Addendum)
Custom orthotic referral See me again in 2 months

## 2023-07-31 ENCOUNTER — Other Ambulatory Visit: Payer: Self-pay | Admitting: Family Medicine

## 2023-07-31 ENCOUNTER — Other Ambulatory Visit (HOSPITAL_COMMUNITY): Payer: Self-pay

## 2023-07-31 MED ORDER — LISDEXAMFETAMINE DIMESYLATE 40 MG PO CAPS
40.0000 mg | ORAL_CAPSULE | ORAL | 0 refills | Status: DC
  Filled 2023-07-31: qty 30, 30d supply, fill #0

## 2023-08-01 ENCOUNTER — Ambulatory Visit (INDEPENDENT_AMBULATORY_CARE_PROVIDER_SITE_OTHER): Payer: 59 | Admitting: Allergy

## 2023-08-01 ENCOUNTER — Encounter: Payer: Self-pay | Admitting: Allergy

## 2023-08-01 ENCOUNTER — Other Ambulatory Visit (HOSPITAL_COMMUNITY): Payer: Self-pay

## 2023-08-01 ENCOUNTER — Other Ambulatory Visit: Payer: Self-pay

## 2023-08-01 VITALS — BP 162/102 | HR 82 | Temp 98.4°F | Resp 16 | Ht 69.88 in | Wt 193.7 lb

## 2023-08-01 DIAGNOSIS — R030 Elevated blood-pressure reading, without diagnosis of hypertension: Secondary | ICD-10-CM

## 2023-08-01 DIAGNOSIS — Z888 Allergy status to other drugs, medicaments and biological substances status: Secondary | ICD-10-CM | POA: Diagnosis not present

## 2023-08-01 DIAGNOSIS — T50905D Adverse effect of unspecified drugs, medicaments and biological substances, subsequent encounter: Secondary | ICD-10-CM

## 2023-08-01 DIAGNOSIS — F4323 Adjustment disorder with mixed anxiety and depressed mood: Secondary | ICD-10-CM | POA: Diagnosis not present

## 2023-08-01 MED ORDER — AMOXICILLIN 250 MG/5ML PO SUSR
ORAL | 0 refills | Status: DC
Start: 2023-08-01 — End: 2023-10-01
  Filled 2023-08-01: qty 100, 10d supply, fill #0

## 2023-08-01 MED ORDER — SULFAMETHOXAZOLE-TRIMETHOPRIM 200-40 MG/5ML PO SUSP
ORAL | 0 refills | Status: DC
  Filled 2023-08-01: qty 100, fill #0

## 2023-08-01 NOTE — Patient Instructions (Addendum)
Schedule for in-office drug challenges to Penicillin and Sulfa done on different days.  Hold all antihistamines for at least 3 days prior to challenge days.  Bring in medication on challenge days (will send to pharmacy). Plan to be in office for at least 2 hours for challenges.   Elevated BP reading in office.  He was monitor BP at home.  He identifies some stressors currently likely increasing his BP reading.

## 2023-08-01 NOTE — Progress Notes (Signed)
New Patient Note  RE: PHALEN BONNAR MRN: [id number] DOB: 1981-03-03 Date of Office Visit: 08/01/2023  Primary care provider: [name], MD  Chief Complaint: Penicillin and sulfa allergy  History of present illness: Jason [name] is a 42 y.o. male presenting today for evaluation of drug allergy.  As a child he was told he was allergic to penicillin and sulfa drugs by his parents.  He asked his parents and states there was an office test at the pediatrician's office and he had some type of reaction (breathing involved) and received treatment.  Has avoided penicillins since.  He states has used keflex as as adult without issue and was given ceclor as a kid without issue.  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  He also states he has use silvadene topically as an adult without issue.  He has history of mild seasonal allergies. Every once and a while he will use Claritin mostly during tree pollen season in spring.   Has tried Zyrtec but this caused dizziness on multiple occasions thus avoids.  He does performs nasal saline irrigation as needed and does work great.  He used to use astelin which was helpful but was drying out nose too much thus discontinued use.   He has seen Dr. Pollyann Kennedy with ENT for snoring and nasal obstruction.  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  He was recommended to use sinus home, more frequent saline spray use as well as potential septoplasty with turbinate reduction.  No history of asthma.  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  He has had hives as a child.  He reports his wife states he does snore and it is significant but has not noted apneic episodes.   Review of systems: 10pt ROS negative unless noted above in HPI  Past medical  history: Past Medical History:  Diagnosis Date   Allergy    Hypertension    Migraine     Past surgical history: Past Surgical History:  Procedure Laterality Date   ADENOIDECTOMY     SALIVARY GLAND SURGERY     TONSILLECTOMY     WISDOM TOOTH EXTRACTION      Family history:  Family History  Problem Relation Age of Onset   Allergic rhinitis Mother    Arthritis Mother    Hypertension Mother    Prostate cancer Father    Skin cancer Father    Cancer Father    Prostate cancer Paternal Uncle    Stroke Paternal Grandmother    Prostate cancer Paternal Grandfather    Stroke Paternal Grandfather    Parkinson's disease Paternal Grandfather     Social history: He lives today with carpeting in the bedroom sting and central cooling.  2 cats in the room.  There is no concern for water damage, mildew or roaches in the home. He is a Education officer, community.  Denies smoking history.   Medication List: Current Outpatient Medications  Medication Sig Dispense Refill   amoxicillin (AMOXIL) 250 MG/5ML suspension Bring to MD office for challenge. 80 mL 0   Famotidine-Ca Carb-Mag Hydrox (PEPCID COMPLETE PO) Take by mouth.     lisdexamfetamine (VYVANSE) 40 MG capsule Take 1 capsule (40 mg total) by mouth every morning. 30 capsule 0   losartan (COZAAR)  25 MG tablet Take 1 tablet (25 mg total) by mouth daily. 90 tablet 3   Multiple Vitamin (MULTIVITAMIN ADULT PO) Take by mouth.     Semaglutide,0.25 or 0.5MG /DOS, 2 MG/1.5ML SOPN Inject 0.25-0.5 mg into the skin once a week. Take 0.25 mg weekly for 4 weeks then increase to 0.5 mg weekly 6 mL 0   sulfamethoxazole-trimethoprim (BACTRIM) 200-40 MG/5ML suspension Bring to MD office for challenge 100 mL 0   azelastine (ASTELIN) 0.1 % nasal spray Place 2 sprays into both nostrils 2 (two) times daily. (Patient not taking: Reported on 08/01/2023) 30 mL 12   Ibuprofen 200 MG CAPS      tiZANidine (ZANAFLEX) 4 MG tablet Take 1 tablet (4 mg total) by mouth every 6 (six) hours  as needed for muscle spasms. (Patient not taking: Reported on 08/01/2023) 30 tablet 0   No current facility-administered medications for this visit.    Known medication allergies: Allergies  Allergen Reactions   Penicillins    Sulfa Antibiotics Other (See Comments)     Physical examination: Blood pressure (!) 162/102, pulse 82, temperature 98.4 F (36.9 C), temperature source Temporal, resp. rate 16, height 5' 9.88" (1.775 m), weight 193 lb 11.2 oz (87.9 kg), SpO2 100%.  General: Alert, interactive, in no acute distress. HEENT: PERRLA, TMs pearly gray, turbinates minimally edematous without discharge with leftward septal deviation, post-pharynx non erythematous. Neck: Supple without lymphadenopathy. Lungs: Clear to auscultation without wheezing, rhonchi or rales. {no increased work of breathing. CV: Normal S1, S2 without murmurs. Abdomen: Nondistended, nontender. Skin: Warm and dry, without lesions or rashes. Extremities:  No clubbing, cyanosis or edema. Neuro:   Grossly intact.  Diagnositics/Labs: None today  Assessment and plan: Drug allergy Schedule for in-office drug challenges to Penicillin and Sulfa done on different days.  Hold all antihistamines for at least 3 days prior to challenge days.  Bring in medication on challenge days (will send to pharmacy). Plan to be in office for at least 2 hours for challenges.   Elevated BP reading in office.  He was monitor BP at home.  He identifies some stressors currently likely increasing his BP reading.    I appreciate the opportunity to take part in Imre's care. Please do not hesitate to contact me with questions.  Sincerely,   Margo Aye, MD Allergy/Immunology [facility]

## 2023-08-08 ENCOUNTER — Encounter: Payer: Self-pay | Admitting: Family Medicine

## 2023-08-08 ENCOUNTER — Ambulatory Visit (INDEPENDENT_AMBULATORY_CARE_PROVIDER_SITE_OTHER): Payer: 59 | Admitting: Family Medicine

## 2023-08-08 VITALS — BP 138/88 | Ht 71.0 in | Wt 189.0 lb

## 2023-08-08 DIAGNOSIS — M216X2 Other acquired deformities of left foot: Secondary | ICD-10-CM

## 2023-08-08 DIAGNOSIS — M25579 Pain in unspecified ankle and joints of unspecified foot: Secondary | ICD-10-CM | POA: Diagnosis not present

## 2023-08-08 DIAGNOSIS — M216X1 Other acquired deformities of right foot: Secondary | ICD-10-CM

## 2023-08-08 NOTE — Progress Notes (Signed)
Patient is here for custom orthotics. Patient recently seen by physician at [facility] who recommended patient would benefit from orthotics given his bilateral foot pain and referring knee pain.   Patient was fitted for a : standard, cushioned, semi-rigid orthotic. The orthotic was heated, placed on the orthotic stand. The patient was positioned in subtalar neutral position and 10 degrees of ankle dorsiflexion in a weight bearing stance on the heated orthotic blank After completion of molding, a stable base was applied to the orthotic blank. The blank was ground to a stable position for weight bearing. Blank: 13 Blue eva Base: Blue medium EVA  Posting:None   Patient was able to ambulate with new orthotics without any difficulty and felt significant improvement in his bilateral feet pain..  Patient to follow-up if any changes are to be made.

## 2023-08-09 NOTE — Progress Notes (Signed)
[facility]: Attending Note: I have examined the patient, reviewed the chart, discussed the assessment and plan with the Sports Medicine Fellow. I agree with assessment and treatment plan as detailed in the Fellow's note. I participated in the evaluation and construction of the orthotics.

## 2023-08-21 ENCOUNTER — Other Ambulatory Visit: Payer: Self-pay | Admitting: Family Medicine

## 2023-08-21 ENCOUNTER — Encounter: Payer: Self-pay | Admitting: Family Medicine

## 2023-08-21 MED ORDER — SEMAGLUTIDE(0.25 OR 0.5MG/DOS) 2 MG/1.5ML ~~LOC~~ SOPN: 0.2500 mg | PEN_INJECTOR | SUBCUTANEOUS | 0 refills | Status: DC

## 2023-08-21 NOTE — Telephone Encounter (Signed)
I appreciate the update.  It would be reasonable to increase the dose if needed to help further with appetite suppression and weight loss.  Can we clarify what does he is on currently?  Very interesting and happy to hear about the other benefits as well!  Katina Degree. Jimmey Ralph, MD 08/21/2023 3:03 PM

## 2023-08-21 NOTE — Telephone Encounter (Signed)
See note

## 2023-08-22 DIAGNOSIS — F4323 Adjustment disorder with mixed anxiety and depressed mood: Secondary | ICD-10-CM | POA: Diagnosis not present

## 2023-08-25 NOTE — Telephone Encounter (Signed)
Patient currently  doing the 20 unit (I think that's 0.5mg ) dose right now

## 2023-08-26 ENCOUNTER — Other Ambulatory Visit: Payer: Self-pay | Admitting: *Deleted

## 2023-08-26 ENCOUNTER — Other Ambulatory Visit (HOSPITAL_COMMUNITY): Payer: Self-pay

## 2023-08-26 MED ORDER — SEMAGLUTIDE (1 MG/DOSE) 4 MG/3ML ~~LOC~~ SOPN
1.0000 mg | PEN_INJECTOR | SUBCUTANEOUS | 1 refills | Status: AC
Start: 2023-08-26 — End: ?
  Filled 2023-08-26: qty 3, 28d supply, fill #0

## 2023-08-26 NOTE — Telephone Encounter (Signed)
Ok to increase dose to 1 mg weekly.  Katina Degree. Jimmey Ralph, MD 08/26/2023 7:44 AM

## 2023-08-28 ENCOUNTER — Other Ambulatory Visit (HOSPITAL_COMMUNITY): Payer: Self-pay

## 2023-08-29 ENCOUNTER — Other Ambulatory Visit: Payer: Self-pay

## 2023-08-29 ENCOUNTER — Encounter: Payer: 59 | Admitting: Family Medicine

## 2023-08-29 ENCOUNTER — Other Ambulatory Visit: Payer: Self-pay | Admitting: *Deleted

## 2023-08-29 MED ORDER — SEMAGLUTIDE (1 MG/DOSE) 4 MG/3ML ~~LOC~~ SOPN: 1.0000 mg | PEN_INJECTOR | SUBCUTANEOUS | 1 refills | Status: DC

## 2023-09-02 ENCOUNTER — Other Ambulatory Visit: Payer: Self-pay | Admitting: *Deleted

## 2023-09-02 MED ORDER — SEMAGLUTIDE (1 MG/DOSE) 4 MG/3ML ~~LOC~~ SOPN: 1.0000 mg | PEN_INJECTOR | SUBCUTANEOUS | 1 refills | Status: DC

## 2023-09-03 ENCOUNTER — Other Ambulatory Visit: Payer: Self-pay | Admitting: *Deleted

## 2023-09-04 ENCOUNTER — Telehealth: Payer: Self-pay | Admitting: *Deleted

## 2023-09-04 NOTE — Telephone Encounter (Signed)
(  Key: [id number]) - [id number] Ozempic (1 MG/DOSE) 4MG /3ML pen-injectors status: PA RequestCreated: September 24th, 2024 [phone] Sent: October 3rd, 2024 Waiting for determination

## 2023-09-09 ENCOUNTER — Other Ambulatory Visit (HOSPITAL_COMMUNITY): Payer: Self-pay

## 2023-09-10 NOTE — Telephone Encounter (Signed)
Patient called back stating that PA should not be going through [employer]. [facility] be [facility] in [place]. States this has been done before and he pays out of pocket $190 for the medication. Also states that this is the compounded version of the medication not name brand.    States that the pharmacy sent over a template on what to do in terms of completing this.

## 2023-09-10 NOTE — Telephone Encounter (Signed)
LVM to patient with denied information

## 2023-09-10 NOTE — Telephone Encounter (Signed)
Pharmacy Patient Advocate Encounter  Received notification from [facility] that Prior Authorization for Ozempic (1 MG/DOSE) 4MG /3ML pen-injectors has been DENIED.  Full denial letter will be uploaded to the media tab. See denial reason below.  Based on the information sent for review, the requested drug did not meet our guideline rules. To get the request approved, your doctor needs to show that you have met the guideline rules below.  Our guideline named GLP-1 AGONIST (reviewed for Ozempic) requires that you have a diagnosis of type 2 diabetes mellitus (a chronic condition in which your body is resistant to insulin, a hormone that regulates your blood sugar). Your doctor requested this medication for prediabetes (a health condition that means your blood sugar level is higher than normal, but not yet high enough for you to be diagnosed with diabetes) and for weight loss or weight management, but it is not clinically supported for these uses. Because of this, additional requirements must be met for approval.  *Please see denial letter for additional requirements.  PA #/Case ID/Reference #: [id number]  Please be advised we currently do not have a Pharmacist to review denials, therefore you will need to process appeals accordingly as needed. Thanks for your support at this time. Contact for appeals are as follows: Phone: [phone], Fax: [phone]

## 2023-09-11 NOTE — Telephone Encounter (Signed)
Please advise 

## 2023-09-11 NOTE — Telephone Encounter (Signed)
Rx fax #[phone]

## 2023-09-11 NOTE — Telephone Encounter (Signed)
Can we make sure the fax got sent correctly?  Katina Degree. Jimmey Ralph, MD 09/11/2023 10:11 AM

## 2023-09-11 NOTE — Telephone Encounter (Signed)
Please see other phone note. We need to fax it to the speciality pharmacy.  Katina Degree. Jimmey Ralph, MD 09/11/2023 10:12 AM

## 2023-09-12 ENCOUNTER — Encounter: Payer: 59 | Admitting: Family

## 2023-09-15 ENCOUNTER — Other Ambulatory Visit (HOSPITAL_COMMUNITY): Payer: Self-pay

## 2023-09-15 ENCOUNTER — Other Ambulatory Visit: Payer: Self-pay

## 2023-09-15 ENCOUNTER — Other Ambulatory Visit: Payer: Self-pay | Admitting: Family Medicine

## 2023-09-15 MED ORDER — LISDEXAMFETAMINE DIMESYLATE 40 MG PO CAPS
40.0000 mg | ORAL_CAPSULE | ORAL | 0 refills | Status: DC
Start: 2023-09-15 — End: 2023-11-02
  Filled 2023-09-15: qty 30, 30d supply, fill #0

## 2023-09-19 DIAGNOSIS — F4323 Adjustment disorder with mixed anxiety and depressed mood: Secondary | ICD-10-CM | POA: Diagnosis not present

## 2023-10-01 ENCOUNTER — Encounter: Payer: Self-pay | Admitting: Allergy

## 2023-10-01 ENCOUNTER — Other Ambulatory Visit (HOSPITAL_COMMUNITY): Payer: Self-pay

## 2023-10-01 ENCOUNTER — Other Ambulatory Visit: Payer: Self-pay

## 2023-10-01 MED ORDER — AMOXICILLIN 250 MG/5ML PO SUSR
ORAL | 0 refills | Status: DC
Start: 2023-10-01 — End: 2023-10-10
  Filled 2023-10-01: qty 100, 10d supply, fill #0

## 2023-10-03 ENCOUNTER — Ambulatory Visit: Payer: 59 | Admitting: Family Medicine

## 2023-10-03 DIAGNOSIS — F4323 Adjustment disorder with mixed anxiety and depressed mood: Secondary | ICD-10-CM | POA: Diagnosis not present

## 2023-10-08 NOTE — Progress Notes (Signed)
[name] [address] [place] [address] Phone: [phone] Subjective:   Jason [name], am serving as a scribe for Dr. Antoine Primas.  I'm seeing this patient by the request  of:  [name], MD  CC: Neck pain, headache follow-up  ION:[id number]  DYON WAYCHOFF is a 42 y.o. male coming in with complaint of back and neck pain. OMT 07/25/2023. Patient states that he has pain in L achilles going up and down stairs. Has been working on stretching.   Also c/o pain in front of ankle joint on R foot. Also c/o pain over 1st MTP joint. Does have bunion.   Anterior scalene on R side of neck and trap is constantly tight.   Medications patient has been prescribed: None           Reviewed prior external information including notes and imaging from previsou exam, outside providers and external EMR if available.   As well as notes that were available from care everywhere and other healthcare systems.  Past medical history, social, surgical and family history all reviewed in electronic medical record.  No pertanent information unless stated regarding to the chief complaint.   Past Medical History:  Diagnosis Date   ADHD (attention deficit hyperactivity disorder)    Allergy    Hypertension    Migraine    Nasal septal deviation     Allergies  Allergen Reactions   Penicillins    Sulfa Antibiotics Other (See Comments)     Review of Systems:  No  visual changes, nausea, vomiting, diarrhea, constipation, dizziness, abdominal pain, skin rash, fevers, chills, night sweats, weight loss, swollen lymph nodes, body aches, joint swelling, chest pain, shortness of breath, mood changes. POSITIVE muscle aches, headache  Objective  Blood pressure (!) 148/108, pulse 94, height 5\' 11"  (1.803 m), weight 186 lb (84.4 kg), SpO2 98%.   General: No apparent distress alert and oriented x3 mood and affect normal, dressed appropriately.  HEENT: Pupils  equal, extraocular movements intact  Respiratory: Patient's speak in full sentences and does not appear short of breath  Cardiovascular: No lower extremity edema, non tender, no erythema  Neck exam does have some loss lordosis.  Tightness in the scalenes noted on the right side.  Negative Spurling's.  Tightness noted also in the parascapular area right greater than left.  Tightness of the trapezius on the right.  Osteopathic findings  C2 flexed rotated and side bent right C6 flexed rotated and side bent right T3 extended rotated and side bent right inhaled rib T9 extended rotated and side bent left L2 flexed rotated and side bent right Sacrum right on right       Assessment and Plan:  Cervicogenic migraine Overall doing better.  Discussed icing regimen and home exercises, which activities to do and which ones to avoid.  Increase activity slowly.  Follow-up again in 6 to 8 weeks.  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  Is going to have his orthotics adjusted which I think will be helpful.  Follow-up with me again in 6 to 8 weeks otherwise.  Do feel that a heel lift could be beneficial for both of the medial and lateral aspects of the ankle and foot.    Nonallopathic problems  Decision today to treat with OMT was based on Physical Exam  After verbal consent patient was treated with HVLA, ME, FPR techniques in cervical, rib,  thoracic, lumbar, and sacral  areas  Patient tolerated the procedure well with improvement in symptoms  Patient given exercises, stretches and lifestyle modifications  See medications in patient instructions if given  Patient will follow up in 4-8 weeks             Note: This dictation was prepared with Dragon dictation along with smaller phrase technology. Any transcriptional errors that result from this process are unintentional.

## 2023-10-09 ENCOUNTER — Encounter: Payer: Self-pay | Admitting: Family Medicine

## 2023-10-09 NOTE — Telephone Encounter (Signed)
I appreciate the updates.  I am glad that the GLP-1 agonist dose will be working well for him.  He can continue with the current dose and let us know if he has any new issues arise.  Believe he will do well with his surgery-he should let us know if we can be of any further assistance.

## 2023-10-10 ENCOUNTER — Encounter (HOSPITAL_BASED_OUTPATIENT_CLINIC_OR_DEPARTMENT_OTHER)
Admission: RE | Admit: 2023-10-10 | Discharge: 2023-10-10 | Disposition: A | Discharge status: Home / Self Care | Payer: 59 | Source: Ambulatory Visit | Attending: Otolaryngology | Admitting: Otolaryngology

## 2023-10-10 ENCOUNTER — Ambulatory Visit (INDEPENDENT_AMBULATORY_CARE_PROVIDER_SITE_OTHER): Payer: 59 | Admitting: Allergy

## 2023-10-10 ENCOUNTER — Encounter: Payer: Self-pay | Admitting: Allergy

## 2023-10-10 ENCOUNTER — Ambulatory Visit (INDEPENDENT_AMBULATORY_CARE_PROVIDER_SITE_OTHER): Payer: 59 | Admitting: Family Medicine

## 2023-10-10 ENCOUNTER — Other Ambulatory Visit: Payer: Self-pay

## 2023-10-10 ENCOUNTER — Encounter (HOSPITAL_BASED_OUTPATIENT_CLINIC_OR_DEPARTMENT_OTHER): Payer: Self-pay | Admitting: Otolaryngology

## 2023-10-10 ENCOUNTER — Encounter: Payer: Self-pay | Admitting: Family Medicine

## 2023-10-10 VITALS — BP 148/108 | HR 94 | Ht 71.0 in | Wt 186.0 lb

## 2023-10-10 DIAGNOSIS — Z01812 Encounter for preprocedural laboratory examination: Secondary | ICD-10-CM | POA: Diagnosis not present

## 2023-10-10 DIAGNOSIS — G43809 Other migraine, not intractable, without status migrainosus: Secondary | ICD-10-CM | POA: Diagnosis not present

## 2023-10-10 DIAGNOSIS — M9904 Segmental and somatic dysfunction of sacral region: Secondary | ICD-10-CM | POA: Diagnosis not present

## 2023-10-10 DIAGNOSIS — M76822 Posterior tibial tendinitis, left leg: Secondary | ICD-10-CM

## 2023-10-10 DIAGNOSIS — M9903 Segmental and somatic dysfunction of lumbar region: Secondary | ICD-10-CM

## 2023-10-10 DIAGNOSIS — M76821 Posterior tibial tendinitis, right leg: Secondary | ICD-10-CM

## 2023-10-10 DIAGNOSIS — M9901 Segmental and somatic dysfunction of cervical region: Secondary | ICD-10-CM | POA: Diagnosis not present

## 2023-10-10 DIAGNOSIS — Z880 Allergy status to penicillin: Secondary | ICD-10-CM | POA: Diagnosis not present

## 2023-10-10 DIAGNOSIS — T50905D Adverse effect of unspecified drugs, medicaments and biological substances, subsequent encounter: Secondary | ICD-10-CM

## 2023-10-10 DIAGNOSIS — M9908 Segmental and somatic dysfunction of rib cage: Secondary | ICD-10-CM

## 2023-10-10 DIAGNOSIS — M9902 Segmental and somatic dysfunction of thoracic region: Secondary | ICD-10-CM | POA: Diagnosis not present

## 2023-10-10 NOTE — Progress Notes (Signed)
   10/10/23 1017  PAT Phone Screen  Is the patient taking a GLP-1 receptor agonist? (S)  Yes  Has the patient been informed on holding medication? (S)  Yes (today will be last dose, will skip next Friday 11/8 and resume after surgery.)  Do You Have Diabetes? No  Do You Have Hypertension? Yes  Have You Ever Been to the ER for Asthma? No  Have You Taken Oral Steroids in the Past 3 Months? No  Do you Take Phenteramine or any Other Diet Drugs? No  Recent  Lab Work, EKG, CXR? No  Do you have a history of heart problems? No  Any Recent Hospitalizations? No  Height 5\' 11"  (1.803 m)  Weight 84.4 kg  Pat Appointment Scheduled Yes (EKG)

## 2023-10-10 NOTE — Progress Notes (Signed)
    Follow-up Note  RE: Jason [name] MRN: [id number] DOB: 29-Nov-1981 Date of Office Visit: 10/10/2023   History of present illness: Jason [name] is a 42 y.o. male presenting today for drug challenge to penicillin.  He was last seen in the office on 08/01/23 by myself.  He is in his usual state of health today without recent illnesses or antibiotic needs.  He has not had any antihistamines for at least the past 3 days for challenge today.  Review of systems: 10pt ROS negative unless noted above in HPI   All other systems negative unless noted above in HPI  Past medical/social/surgical/family history have been reviewed and are unchanged unless specifically indicated below.  No changes  Medication List: Current Outpatient Medications  Medication Sig Dispense Refill   Famotidine-Ca Carb-Mag Hydrox (PEPCID COMPLETE PO) Take by mouth.     lisdexamfetamine (VYVANSE) 40 MG capsule Take 1 capsule (40 mg total) by mouth every morning. 30 capsule 0   losartan (COZAAR) 25 MG tablet Take 1 tablet (25 mg total) by mouth daily. 90 tablet 3   Multiple Vitamin (MULTIVITAMIN ADULT PO) Take by mouth.     Semaglutide, 1 MG/DOSE, 4 MG/3ML SOPN Inject 1 mg as directed once a week. 3 mL 1   tiZANidine (ZANAFLEX) 4 MG tablet Take 1 tablet (4 mg total) by mouth every 6 (six) hours as needed for muscle spasms. 30 tablet 0   No current facility-administered medications for this visit.     Known medication allergies: Allergies  Allergen Reactions   Sulfa Antibiotics Other (See Comments)     Physical examination:  General: Alert, interactive, in no acute distress. HEENT: PERRLA, TMs pearly gray, turbinates non-edematous without discharge, post-pharynx non erythematous. Neck: Supple without lymphadenopathy. Lungs: Clear to auscultation without wheezing, rhonchi or rales. {no increased work of breathing. CV: Normal S1, S2 without murmurs. Abdomen: Nondistended, nontender. Skin: Warm and dry,  without lesions or rashes. Extremities:  No clubbing, cyanosis or edema. Neuro:   Grossly intact.  Diagnositics/Labs:   Oral Challenge - 10/10/23 1000     Challenge Food/Drug Amoxicillin    Food/Drug provided by Patient    BP 150/90    Pulse 98    Respirations 18    Time 0955    Dose 0.51mL    Time 1025    Dose 1mL    Time 1040    Dose 9mL    BP 140/100             Drug challenge to penicillin with use of amoxicillin 250 mg/5 mL.  Benefits and risks of challenge discussed and consent obtained.  He was provided with 1%, 10% 90% doses of total 500 mg every 20 minutes.  He was observed for additional hour after completion of ingestion challenge.  He had no signs/symptoms of allergic reaction.  Vitals were obtained prior to discharge and remained stable.    Assessment and plan: Adverse medication effect  Penicillin oral challenge in-office performed today he has successfully passed.   No longer allergic to penicillins and able to use when needed for infection treatment when appropriate.  Will remove penicillin allergy label from chart.    Next challenge schedule for sulfa drug.    I appreciate the opportunity to take part in Jason [name]'s care. Please do not hesitate to contact me with questions.  Sincerely,   Margo Aye, MD Allergy/Immunology [facility]

## 2023-10-10 NOTE — Patient Instructions (Addendum)
Penicillin oral challenge in-office performed today he has successfully passed.   No longer allergic to penicillins and able to use when needed for infection treatment when appropriate.  Will remove penicillin allergy label from chart.    Next challenge schedule for sulfa drug.

## 2023-10-10 NOTE — Patient Instructions (Addendum)
Good to see you  Have a great holiday season Call about orthotic See me in 7-8 weeks

## 2023-10-10 NOTE — Assessment & Plan Note (Signed)
Patient has had some mild hallux limitus noted as well.  Is going to have his orthotics adjusted which I think will be helpful.  Follow-up with me again in 6 to 8 weeks otherwise.  Do feel that a heel lift could be beneficial for both of the medial and lateral aspects of the ankle and foot.

## 2023-10-10 NOTE — Assessment & Plan Note (Signed)
Overall doing better.  Discussed icing regimen and home exercises, which activities to do and which ones to avoid.  Increase activity slowly.  Follow-up again in 6 to 8 weeks.

## 2023-10-16 NOTE — H&P (Signed)
HPI:   Jason [name] is a 42 y.o. male who presents as a consult Patient.   Referring Provider: [name], MD  Chief complaint: Nasal obstruction.  HPI: Long history of nasal obstruction. He has to open his mouth frequently and finds that he wakes up in the morning with very dry mouth. He is also snoring a lot and his wife has been complaining about his snoring. He gets a good night sleep. No history of nasal injury. He does have significant allergies. He has been using saline irrigation with good results.  PMH/Meds/All/SocHx/FamHx/ROS:   History reviewed. No pertinent past medical history.  History reviewed. No pertinent surgical history.  No family history of bleeding disorders, wound healing problems or difficulty with anesthesia.     Current Outpatient Medications:  lisdexamfetamine (VYVANSE) 40 mg capsule, Take 40 mg by mouth every morning., Disp: , Rfl:  losartan (COZAAR) 25 mg tablet, Take 25 mg by mouth daily., Disp: , Rfl:  SEMAGLUTIDE, WEIGHT LOSS, SUBQ, , Disp: , Rfl:  tiZANidine (Zanaflex) 4 mg tablet, Take 4 mg by mouth every 6 (six) hours as needed., Disp: , Rfl:  ibuprofen 200 mg cap, , Disp: , Rfl:   A complete ROS was performed with pertinent positives/negatives noted in the HPI. The remainder of the ROS are negative.   Physical Exam:   There were no vitals taken for this visit.  General: Healthy and alert, in no distress, breathing easily. Normal affect. In a pleasant mood. Head: Normocephalic, atraumatic. No masses, or scars. Eyes: Pupils are equal, and reactive to light. Vision is grossly intact. No spontaneous or gaze nystagmus. Ears: Ear canals are clear. Tympanic membranes are intact, with normal landmarks and the middle ears are clear and healthy. Hearing: Grossly normal. Nose: Nasal cavities are clear with healthy mucosa, no polyps or exudate. Airways are restricted due to a significant leftward septal deviation. He also has significant valve  collapse bilaterally with strong inhalation. Face: No masses or scars, facial nerve function is symmetric. Oral Cavity: No mucosal abnormalities are noted. Tongue with normal mobility. Dentition appears healthy. Oropharynx: Tonsils are symmetric. There are no mucosal masses identified. Tongue base appears normal and healthy. Larynx/Hypopharynx: deferred Chest: Deferred Neck: No palpable masses, no cervical adenopathy, no thyroid nodules or enlargement. Neuro: Cranial nerves II-XII with normal function. Balance: Normal gate. Other findings: none.  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 I think he would benefit with sinus cones that he can purchase online. I think he would benefit with more frequent use of saline spray throughout the day. I think he may also benefit with nasal septoplasty and turbinate reduction. He may still have trouble with valving postop. Risks and benefits were discussed in detail. He will think about it and will let us know when he is ready.

## 2023-10-20 ENCOUNTER — Encounter (HOSPITAL_BASED_OUTPATIENT_CLINIC_OR_DEPARTMENT_OTHER): Payer: Self-pay | Admitting: Otolaryngology

## 2023-10-20 ENCOUNTER — Encounter (HOSPITAL_BASED_OUTPATIENT_CLINIC_OR_DEPARTMENT_OTHER)
Admission: RE | Disposition: A | Discharge status: Home / Self Care | Payer: Self-pay | Source: Home / Self Care | Attending: Otolaryngology

## 2023-10-20 ENCOUNTER — Ambulatory Visit (HOSPITAL_BASED_OUTPATIENT_CLINIC_OR_DEPARTMENT_OTHER): Payer: 59 | Admitting: Anesthesiology

## 2023-10-20 ENCOUNTER — Ambulatory Visit (HOSPITAL_BASED_OUTPATIENT_CLINIC_OR_DEPARTMENT_OTHER)
Admission: RE | Admit: 2023-10-20 | Discharge: 2023-10-20 | Disposition: A | Discharge status: Home / Self Care | Payer: 59 | Attending: Otolaryngology | Admitting: Otolaryngology

## 2023-10-20 ENCOUNTER — Other Ambulatory Visit (HOSPITAL_COMMUNITY): Payer: Self-pay

## 2023-10-20 DIAGNOSIS — J342 Deviated nasal septum: Secondary | ICD-10-CM

## 2023-10-20 DIAGNOSIS — I10 Essential (primary) hypertension: Secondary | ICD-10-CM | POA: Insufficient documentation

## 2023-10-20 DIAGNOSIS — J34829 Nasal valve collapse, unspecified: Secondary | ICD-10-CM | POA: Diagnosis not present

## 2023-10-20 HISTORY — DX: Attention-deficit hyperactivity disorder, unspecified type: F90.9

## 2023-10-20 HISTORY — DX: Deviated nasal septum: J34.2

## 2023-10-20 HISTORY — PX: TURBINATE REDUCTION: SHX6157

## 2023-10-20 HISTORY — PX: SEPTOPLASTY: SHX2393

## 2023-10-20 SURGERY — Surgical Case
Anesthesia: *Unknown

## 2023-10-20 SURGERY — SEPTOPLASTY, NOSE
Anesthesia: General | Site: Nose | Laterality: Bilateral

## 2023-10-20 MED ORDER — SODIUM CHLORIDE (PF) 0.9 % IJ SOLN
INTRAMUSCULAR | Status: AC
Start: 2023-10-20 — End: ?
  Filled 2023-10-20: qty 10

## 2023-10-20 MED ORDER — OXYCODONE HCL 5 MG PO TABS
5.0000 mg | ORAL_TABLET | Freq: Once | ORAL | Status: AC | PRN
  Administered 2023-10-20: 5 mg via ORAL

## 2023-10-20 MED ORDER — LACTATED RINGERS IV SOLN
INTRAVENOUS | Status: DC
Start: 2023-10-20 — End: 2023-10-20

## 2023-10-20 MED ORDER — OXYMETAZOLINE HCL 0.05 % NA SOLN
NASAL | Status: AC
Start: 2023-10-20 — End: ?
  Filled 2023-10-20: qty 30

## 2023-10-20 MED ORDER — BACITRACIN ZINC 500 UNIT/GM EX OINT
TOPICAL_OINTMENT | CUTANEOUS | Status: DC | PRN
  Administered 2023-10-20: 1 via TOPICAL

## 2023-10-20 MED ORDER — AMISULPRIDE (ANTIEMETIC) 5 MG/2ML IV SOLN: 10.0000 mg | Freq: Once | INTRAVENOUS | Status: DC | PRN

## 2023-10-20 MED ORDER — ONDANSETRON 4 MG PO TBDP
4.0000 mg | ORAL_TABLET | Freq: Three times a day (TID) | ORAL | 0 refills | Status: DC | PRN
  Filled 2023-10-20: qty 20, 7d supply, fill #0

## 2023-10-20 MED ORDER — FENTANYL CITRATE (PF) 250 MCG/5ML IJ SOLN
INTRAMUSCULAR | Status: DC | PRN
  Administered 2023-10-20 (×2): 50 ug via INTRAVENOUS

## 2023-10-20 MED ORDER — OXYCODONE HCL 5 MG/5ML PO SOLN: 5.0000 mg | Freq: Once | ORAL | Status: AC | PRN

## 2023-10-20 MED ORDER — ACETAMINOPHEN 500 MG PO TABS
1000.0000 mg | ORAL_TABLET | Freq: Once | ORAL | Status: AC
  Administered 2023-10-20: 1000 mg via ORAL

## 2023-10-20 MED ORDER — OXYMETAZOLINE HCL 0.05 % NA SOLN
NASAL | Status: DC | PRN
  Administered 2023-10-20: 1 via TOPICAL

## 2023-10-20 MED ORDER — ACETAMINOPHEN 500 MG PO TABS
ORAL_TABLET | ORAL | Status: AC
Start: 2023-10-20 — End: ?
  Filled 2023-10-20: qty 2

## 2023-10-20 MED ORDER — DEXAMETHASONE SODIUM PHOSPHATE 10 MG/ML IJ SOLN
INTRAMUSCULAR | Status: DC | PRN
  Administered 2023-10-20: 10 mg via INTRAVENOUS

## 2023-10-20 MED ORDER — OXYCODONE HCL 5 MG PO TABS
ORAL_TABLET | ORAL | Status: AC
  Filled 2023-10-20: qty 1

## 2023-10-20 MED ORDER — MIDAZOLAM HCL 2 MG/2ML IJ SOLN
INTRAMUSCULAR | Status: AC
Start: 2023-10-20 — End: ?
  Filled 2023-10-20: qty 2

## 2023-10-20 MED ORDER — SUGAMMADEX SODIUM 200 MG/2ML IV SOLN
INTRAVENOUS | Status: DC | PRN
  Administered 2023-10-20: 200 mg via INTRAVENOUS

## 2023-10-20 MED ORDER — HYDROMORPHONE HCL 1 MG/ML IJ SOLN
0.2500 mg | INTRAMUSCULAR | Status: DC | PRN
  Administered 2023-10-20: 0.5 mg via INTRAVENOUS

## 2023-10-20 MED ORDER — ONDANSETRON HCL 4 MG/2ML IJ SOLN
INTRAMUSCULAR | Status: DC | PRN
  Administered 2023-10-20: 4 mg via INTRAVENOUS

## 2023-10-20 MED ORDER — LIDOCAINE-EPINEPHRINE 1 %-1:100000 IJ SOLN
INTRAMUSCULAR | Status: AC
  Filled 2023-10-20: qty 1

## 2023-10-20 MED ORDER — HYDROCODONE-ACETAMINOPHEN 7.5-325 MG PO TABS
1.0000 | ORAL_TABLET | Freq: Four times a day (QID) | ORAL | 0 refills | Status: DC | PRN
  Filled 2023-10-20: qty 30, 8d supply, fill #0

## 2023-10-20 MED ORDER — OXYMETAZOLINE HCL 0.05 % NA SOLN
2.0000 | NASAL | Status: DC
  Administered 2023-10-20: 2 via NASAL

## 2023-10-20 MED ORDER — ROCURONIUM BROMIDE 10 MG/ML (PF) SYRINGE
PREFILLED_SYRINGE | INTRAVENOUS | Status: DC | PRN
  Administered 2023-10-20: 70 mg via INTRAVENOUS

## 2023-10-20 MED ORDER — BACITRACIN ZINC 500 UNIT/GM EX OINT
TOPICAL_OINTMENT | CUTANEOUS | Status: AC
Start: 2023-10-20 — End: ?
  Filled 2023-10-20: qty 28.35

## 2023-10-20 MED ORDER — LIDOCAINE 2% (20 MG/ML) 5 ML SYRINGE
INTRAMUSCULAR | Status: AC
  Filled 2023-10-20: qty 5

## 2023-10-20 MED ORDER — LIDOCAINE-EPINEPHRINE 1 %-1:100000 IJ SOLN
INTRAMUSCULAR | Status: DC | PRN
  Administered 2023-10-20: 6 mL

## 2023-10-20 MED ORDER — LIDOCAINE 2% (20 MG/ML) 5 ML SYRINGE
INTRAMUSCULAR | Status: DC | PRN
  Administered 2023-10-20: 60 mg via INTRAVENOUS

## 2023-10-20 MED ORDER — MIDAZOLAM HCL 5 MG/5ML IJ SOLN
INTRAMUSCULAR | Status: DC | PRN
  Administered 2023-10-20: 2 mg via INTRAVENOUS

## 2023-10-20 MED ORDER — FENTANYL CITRATE (PF) 100 MCG/2ML IJ SOLN
INTRAMUSCULAR | Status: AC
  Filled 2023-10-20: qty 2

## 2023-10-20 MED ORDER — SODIUM CHLORIDE 0.9 % IV SOLN: INTRAVENOUS | Status: DC | PRN

## 2023-10-20 MED ORDER — BUPIVACAINE HCL (PF) 0.25 % IJ SOLN
INTRAMUSCULAR | Status: AC
  Filled 2023-10-20: qty 30

## 2023-10-20 MED ORDER — HYDROMORPHONE HCL 1 MG/ML IJ SOLN
INTRAMUSCULAR | Status: AC
  Filled 2023-10-20: qty 0.5

## 2023-10-20 MED ORDER — PROPOFOL 10 MG/ML IV BOLUS
INTRAVENOUS | Status: DC | PRN
  Administered 2023-10-20: 200 mg via INTRAVENOUS

## 2023-10-20 MED ORDER — BUPIVACAINE-EPINEPHRINE (PF) 0.25% -1:200000 IJ SOLN
INTRAMUSCULAR | Status: AC
Start: 2023-10-20 — End: ?
  Filled 2023-10-20: qty 30

## 2023-10-20 MED ORDER — PHENYLEPHRINE 80 MCG/ML (10ML) SYRINGE FOR IV PUSH (FOR BLOOD PRESSURE SUPPORT)
PREFILLED_SYRINGE | INTRAVENOUS | Status: DC | PRN
  Administered 2023-10-20 (×3): 80 ug via INTRAVENOUS

## 2023-10-20 MED ORDER — ONDANSETRON HCL 4 MG/2ML IJ SOLN
INTRAMUSCULAR | Status: AC
  Filled 2023-10-20: qty 2

## 2023-10-20 MED ORDER — ONDANSETRON HCL 4 MG/2ML IJ SOLN: 4.0000 mg | Freq: Once | INTRAMUSCULAR | Status: DC | PRN

## 2023-10-20 MED ORDER — DEXMEDETOMIDINE HCL IN NACL 80 MCG/20ML IV SOLN
INTRAVENOUS | Status: DC | PRN
  Administered 2023-10-20: 10 ug via INTRAVENOUS

## 2023-10-20 SURGICAL SUPPLY — 30 items
ATTRACTOMAT 16X20 MAGNETIC DRP (DRAPES) IMPLANT
BLADE SURG 11 STRL SS (BLADE) IMPLANT
CANISTER SUCT 1200ML W/VALVE (MISCELLANEOUS) ×1 IMPLANT
CATH ROBINSON RED A/P 8FR (CATHETERS) IMPLANT
COAGULATOR SUCT 8FR VV (MISCELLANEOUS) IMPLANT
DRSG TELFA 3X8 NADH STRL (GAUZE/BANDAGES/DRESSINGS) ×1 IMPLANT
ELECT REM PT RETURN 9FT ADLT (ELECTROSURGICAL) ×1
ELECTRODE REM PT RTRN 9FT ADLT (ELECTROSURGICAL) IMPLANT
GAUZE 4X4 16PLY ~~LOC~~+RFID DBL (SPONGE) IMPLANT
GAUZE SPONGE 2X2 STRL 8-PLY (GAUZE/BANDAGES/DRESSINGS) ×1 IMPLANT
GLOVE ECLIPSE 7.5 STRL STRAW (GLOVE) ×1 IMPLANT
GOWN STRL REUS W/ TWL LRG LVL3 (GOWN DISPOSABLE) ×2 IMPLANT
GOWN STRL REUS W/TWL LRG LVL3 (GOWN DISPOSABLE) ×2
HEMOSTAT SURGICEL .5X2 ABSORB (HEMOSTASIS) IMPLANT
NDL PRECISIONGLIDE 27X1.5 (NEEDLE) ×1 IMPLANT
NEEDLE PRECISIONGLIDE 27X1.5 (NEEDLE) ×1
NS IRRIG 1000ML POUR BTL (IV SOLUTION) IMPLANT
PACK BASIN DAY SURGERY FS (CUSTOM PROCEDURE TRAY) ×1 IMPLANT
PACK ENT DAY SURGERY (CUSTOM PROCEDURE TRAY) ×1 IMPLANT
PATTIES SURGICAL .5 X3 (DISPOSABLE) ×1 IMPLANT
SHEET SILICONE 2X3 0.03 REINF (MISCELLANEOUS) IMPLANT
SPIKE FLUID TRANSFER (MISCELLANEOUS) IMPLANT
STRIP CLOSURE SKIN 1/2X4 (GAUZE/BANDAGES/DRESSINGS) IMPLANT
SUT CHROMIC 4 0 P 3 18 (SUTURE) ×1 IMPLANT
SUT ETHILON 3 0 PS 1 (SUTURE) IMPLANT
SUT ETHILON 4 0 CL P 3 (SUTURE) IMPLANT
SUT ETHILON 6 0 P 1 (SUTURE) IMPLANT
SUT PLAIN 4 0 ~~LOC~~ 1 (SUTURE) ×1 IMPLANT
TOWEL GREEN STERILE FF (TOWEL DISPOSABLE) ×1 IMPLANT
YANKAUER SUCT BULB TIP NO VENT (SUCTIONS) ×1 IMPLANT

## 2023-10-20 NOTE — Anesthesia Preprocedure Evaluation (Addendum)
Anesthesia Evaluation  Patient identified by MRN, date of birth, ID band Patient awake    Reviewed: Allergy & Precautions, H&P , NPO status , Patient's Chart, lab work & pertinent test results  Airway Mallampati: II  TM Distance: >3 FB Neck ROM: Full    Dental  (+) Teeth Intact, Dental Advisory Given   Pulmonary  Snores at night, no sleep study    Pulmonary exam normal breath sounds clear to auscultation       Cardiovascular hypertension (148/101), Pt. on medications Normal cardiovascular exam Rhythm:Regular Rate:Normal     Neuro/Psych  Headaches  negative psych ROS   GI/Hepatic negative GI ROS, Neg liver ROS,,,  Endo/Other  negative endocrine ROS    Renal/GU negative Renal ROS  negative genitourinary   Musculoskeletal negative musculoskeletal ROS (+)    Abdominal   Peds negative pediatric ROS (+)  Hematology negative hematology ROS (+)   Anesthesia Other Findings Semaglutide inj LD 10d ago  Reproductive/Obstetrics negative OB ROS                             Anesthesia Physical Anesthesia Plan  ASA: 2  Anesthesia Plan: General   Post-op Pain Management: Tylenol PO (pre-op)*   Induction: Intravenous  PONV Risk Score and Plan: 2 and Ondansetron, Dexamethasone, Treatment may vary due to age or medical condition and Midazolam  Airway Management Planned: Oral ETT  Additional Equipment: None  Intra-op Plan:   Post-operative Plan: Extubation in OR  Informed Consent: I have reviewed the patients History and Physical, chart, labs and discussed the procedure including the risks, benefits and alternatives for the proposed anesthesia with the patient or authorized representative who has indicated his/her understanding and acceptance.     Dental advisory given  Plan Discussed with: CRNA  Anesthesia Plan Comments:        Anesthesia Quick Evaluation

## 2023-10-20 NOTE — Transfer of Care (Signed)
Immediate Anesthesia Transfer of Care Note  Patient: Jason [name]  Procedure(s) Performed: SEPTOPLASTY (Bilateral: Nose) TURBINATE REDUCTION (Bilateral: Nose)  Patient Location: PACU  Anesthesia Type:General  Level of Consciousness: awake and patient cooperative  Airway & Oxygen Therapy: Patient Spontanous Breathing and Patient connected to face mask oxygen  Post-op Assessment: Report given to RN and Post -op Vital signs reviewed and stable  Post vital signs: Reviewed and stable  Last Vitals:  Vitals Value Taken Time  BP 126/102 10/20/23 1023  Temp    Pulse 73 10/20/23 1028  Resp 11 10/20/23 1028  SpO2 99 % 10/20/23 1028  Vitals shown include unfiled device data.  Last Pain:  Vitals:   10/20/23 0742  TempSrc: Temporal  PainSc: 2       Patients Stated Pain Goal: 3 (10/20/23 0742)  Complications: No notable events documented.

## 2023-10-20 NOTE — Anesthesia Postprocedure Evaluation (Signed)
Anesthesia Post Note  Patient: Jason [name]  Procedure(s) Performed: SEPTOPLASTY (Bilateral: Nose) TURBINATE REDUCTION (Bilateral: Nose)     Patient location during evaluation: PACU Anesthesia Type: General Level of consciousness: awake and alert, oriented and patient cooperative Pain management: pain level controlled Vital Signs Assessment: post-procedure vital signs reviewed and stable Respiratory status: spontaneous breathing, nonlabored ventilation and respiratory function stable Cardiovascular status: blood pressure returned to baseline and stable Postop Assessment: no apparent nausea or vomiting Anesthetic complications: no   No notable events documented.  Last Vitals:  Vitals:   10/20/23 1024 10/20/23 1030  BP:  (!) 144/98  Pulse: 77 78  Resp: 15 12  Temp:    SpO2: 98% 100%    Last Pain:  Vitals:   10/20/23 1023  TempSrc:   PainSc: 4                  Lannie Fields

## 2023-10-20 NOTE — Discharge Instructions (Addendum)
Keep head of bed elevated.  Avoid bending over or any physical straining.  Okay to use normal dose of Tylenol and/or ibuprofen.  If needed use the prescription pain medicine.   Post Anesthesia Home Care Instructions  Activity: Get plenty of rest for the remainder of the day. A responsible individual must stay with you for 24 hours following the procedure.  For the next 24 hours, DO NOT: -Drive a car -Advertising copywriter -Drink alcoholic beverages -Take any medication unless instructed by your physician -Make any legal decisions or sign important papers.  Meals: Start with liquid foods such as gelatin or soup. Progress to regular foods as tolerated. Avoid greasy, spicy, heavy foods. If nausea and/or vomiting occur, drink only clear liquids until the nausea and/or vomiting subsides. Call your physician if vomiting continues.  Special Instructions/Symptoms: Your throat may feel dry or sore from the anesthesia or the breathing tube placed in your throat during surgery. If this causes discomfort, gargle with warm salt water. The discomfort should disappear within 24 hours.  If you had a scopolamine patch placed behind your ear for the management of post- operative nausea and/or vomiting:  1. The medication in the patch is effective for 72 hours, after which it should be removed.  Wrap patch in a tissue and discard in the trash. Wash hands thoroughly with soap and water. 2. You may remove the patch earlier than 72 hours if you experience unpleasant side effects which may include dry mouth, dizziness or visual disturbances. 3. Avoid touching the patch. Wash your hands with soap and water after contact with the patch.  No tylenol until after 2:pm if needed today.

## 2023-10-20 NOTE — Op Note (Signed)
OPERATIVE REPORT  DATE OF SURGERY: 10/20/2023  PATIENT:  Jason [name],  42 y.o. male  PRE-OPERATIVE DIAGNOSIS:  Snoring,Nasal valve collapse,Nasal septal deviation  POST-OPERATIVE DIAGNOSIS:  Snoring,Nasal valve collapse,Nasal septal deviation  PROCEDURE:  Procedure(s): SEPTOPLASTY TURBINATE REDUCTION  SURGEON:  Susy Frizzle, MD  ASSISTANTS: none  ANESTHESIA:   General   EBL: 50 ml  DRAINS: none  LOCAL MEDICATIONS USED:  1% Xylocaine with epinephrine  SPECIMEN:  none  COUNTS:  Correct  PROCEDURE DETAILS: The patient was taken to the operating room and placed on the operating table in the supine position. Following induction of general endotracheal anesthesia, the nose was prepped and draped in a standard fashion. Afrin spray was used preoperatively in the holding area. 1% Xylocaine with epinephrine was infiltrated into the septum, the columella, and the inferior turbinates bilaterally.  1. Nasal septoplasty. A left hemitransfixion incision was created with a 15 scalpel. A mucoperichondrial flap was developed posteriorly down the left side of the nasal septum using a Cottle elevator. This was performed all the way to the sphenoid rostrum. The bony cartilaginous junction was divided and a similar flap was developed down the right side. The ethmoid plate was thickened and severely deflected towards the left side causing significant obstruction on the left.  It was mostly resected.  The maxillary crest was also severely thickened and angled towards the left side.  This was partially removed using a 4 mm osteotome.  2. Submucous resection inferior turbinates bilaterally. The leading edge of the inferior turbinates were incised in a vertical fashion with a #15 scalpel. The Cottle elevator was used to elevate mucosa off bone in all directions. Fragments of the turbinate bone were resected with a Takahashi forceps. The turbinate remnants were outfractured with the Market researcher.  All of these maneuvers greatly increased the nasal airways bilaterally. The nasal cavities were packed with rolled up Telfa coated with bacitracin ointment. The pharynx was suctioned of blood and secretions under direct visualization. The patient was awakened extubated and transferred to recovery in stable condition.    PATIENT DISPOSITION:  To PACU, stable

## 2023-10-20 NOTE — Interval H&P Note (Signed)
History and Physical Interval Note:  10/20/2023 8:47 AM  Jason [name]  has presented today for surgery, with the diagnosis of Snoring,Nasal valve collapse,Nasal septal deviation.  The various methods of treatment have been discussed with the patient and family. After consideration of risks, benefits and other options for treatment, the patient has consented to  Procedure(s): SEPTOPLASTY (Bilateral) TURBINATE REDUCTION (Bilateral) as a surgical intervention.  The patient's history has been reviewed, patient examined, no change in status, stable for surgery.  I have reviewed the patient's chart and labs.  Questions were answered to the patient's satisfaction.     Serena Colonel

## 2023-10-20 NOTE — Anesthesia Procedure Notes (Signed)
Procedure Name: Intubation Date/Time: 10/20/2023 9:35 AM  Performed by: Demetrio Lapping, CRNAPre-anesthesia Checklist: Patient identified, Emergency Drugs available, Suction available and Patient being monitored Patient Re-evaluated:Patient Re-evaluated prior to induction Oxygen Delivery Method: Circle System Utilized Preoxygenation: Pre-oxygenation with 100% oxygen Induction Type: IV induction Ventilation: Two handed mask ventilation required Laryngoscope Size: Glidescope and 4 (Grade 4 view with Mac 4) Grade View: Grade I Tube type: Oral Tube size: 7.5 mm Number of attempts: 2 Airway Equipment and Method: Video-laryngoscopy and Rigid stylet Placement Confirmation: ETT inserted through vocal cords under direct vision, positive ETCO2 and breath sounds checked- equal and bilateral Secured at: 23 cm Tube secured with: Tape Dental Injury: Teeth and Oropharynx as per pre-operative assessment

## 2023-10-21 ENCOUNTER — Encounter (HOSPITAL_BASED_OUTPATIENT_CLINIC_OR_DEPARTMENT_OTHER): Payer: Self-pay | Admitting: Otolaryngology

## 2023-10-23 DIAGNOSIS — J342 Deviated nasal septum: Secondary | ICD-10-CM | POA: Diagnosis not present

## 2023-10-24 ENCOUNTER — Encounter: Payer: 59 | Admitting: Family Medicine

## 2023-11-02 ENCOUNTER — Other Ambulatory Visit: Payer: Self-pay | Admitting: Family Medicine

## 2023-11-03 ENCOUNTER — Other Ambulatory Visit (HOSPITAL_COMMUNITY): Payer: Self-pay

## 2023-11-03 ENCOUNTER — Other Ambulatory Visit: Payer: Self-pay

## 2023-11-03 MED ORDER — LISDEXAMFETAMINE DIMESYLATE 40 MG PO CAPS
40.0000 mg | ORAL_CAPSULE | ORAL | 0 refills | Status: DC
  Filled 2023-11-03: qty 30, 30d supply, fill #0

## 2023-11-11 DIAGNOSIS — J342 Deviated nasal septum: Secondary | ICD-10-CM | POA: Diagnosis not present

## 2023-11-12 ENCOUNTER — Encounter: Payer: Self-pay | Admitting: Allergy

## 2023-11-12 ENCOUNTER — Other Ambulatory Visit (HOSPITAL_COMMUNITY): Payer: Self-pay

## 2023-11-12 ENCOUNTER — Other Ambulatory Visit: Payer: Self-pay

## 2023-11-12 MED ORDER — SULFAMETHOXAZOLE-TRIMETHOPRIM 200-40 MG/5ML PO SUSP
ORAL | 0 refills | Status: DC
  Filled 2023-11-12: qty 50, 5d supply, fill #0

## 2023-11-13 NOTE — Progress Notes (Signed)
[address] [place] [address] Dept: [phone]  FOLLOW UP NOTE  Patient ID: Jason [name], male    DOB: [birth year]  Age: 42 y.o. MRN: [id number] Date of Office Visit: 11/14/2023  Assessment  Chief Complaint: Food/Drug Challenge  HPI Jason [name] is a 42 year old male who presents to the clinic for drug challenge to Bactrim.  He was originally seen in this clinic on 08/01/2023 as a new patient by Dr. Delorse Lek for evaluation of drug allergy to penicillin and sulfa antibiotics.  In the interim, he passed an in office oral amoxicillin challenge on 10/10/2023.  At today's visit, he reports that he is feeling well overall with no cardiopulmonary, gastrointestinal, or integumentary symptoms.  He has not had any antihistamines over the last 3 days.  His current medications are listed in the chart.  Drug Allergies:  No Active Allergies   Physical Exam: BP 130/80   Pulse 79   Temp 98.7 F (37.1 C) (Temporal)   Resp 20   Wt 193 lb 11.2 oz (87.9 kg)   SpO2 99%   BMI 27.02 kg/m    Physical Exam Vitals reviewed.  Constitutional:      Appearance: Normal appearance.  HENT:     Head: Normocephalic and atraumatic.     Right Ear: Tympanic membrane normal.     Left Ear: Tympanic membrane normal.     Nose:     Comments: Pharynx normal.    Mouth/Throat:     Pharynx: Oropharynx is clear.  Eyes:     Conjunctiva/sclera: Conjunctivae normal.  Cardiovascular:     Rate and Rhythm: Normal rate and regular rhythm.     Heart sounds: Normal heart sounds. No murmur heard. Pulmonary:     Effort: Pulmonary effort is normal.     Breath sounds: Normal breath sounds.     Comments: Lungs clear to auscultation Musculoskeletal:        General: Normal range of motion.     Cervical back: Normal range of motion and neck supple.  Skin:    General: Skin is warm and dry.  Neurological:     Mental Status: He is alert and oriented to person, place, and time.  Psychiatric:        Mood and  Affect: Mood normal.        Behavior: Behavior normal.        Thought Content: Thought content normal.        Judgment: Judgment normal.    Procedure note:  Written consent obtained  Open graded Bactrim oral challenge: The patient was able to tolerate the challenge today without adverse signs or symptoms. Vital signs were stable throughout the challenge and observation period. He received multiple doses separated by 30 minutes, each of which was separated by vitals and a brief physical exam. He received the following doses: 1%, 10%, and 89% doses for total of 800 mg of sulfamethoxazole and 160 mg of trimethoprim. He was monitored for 60 minutes following the last dose.  Total testing time: 126 minutes  The patient was able to tolerate the open graded oral challenge today without adverse signs or symptoms. Therefore, he has the same risk of systemic reaction associated with  the use of Bactrim  as the general population.   Assessment and Plan: 1. Drug allergy     Patient Instructions  In office Bactrim oral challenge Jason [name] was able to tolerate the Bactrim drug challenge today at the office without adverse signs or symptoms  of an allergic reaction. Therefore, he has the same risk of systemic reaction associated with the use Batrim as the general population.  - Do not give any Bactrim  for the next 24 hours. - Monitor for allergic symptoms such as rash, wheezing, diarrhea, swelling, and vomiting for the next 24 hours. If severe symptoms occur, call 911. For less severe symptoms treat with Benadryl 50 mg every 4 hours and call the clinic.   Call the clinic if this treatment plan is not working well for you  Follow up in 6 months or sooner if needed.  Return in about 6 months (around 05/14/2024), or if symptoms worsen or fail to improve.    Thank you for the opportunity to care for this patient.  Please do not hesitate to contact me with questions.  Thermon Leyland, FNP [facility]

## 2023-11-13 NOTE — Patient Instructions (Addendum)
In office Bactrim oral challenge Jason [name] was able to tolerate the Bactrim drug challenge today at the office without adverse signs or symptoms of an allergic reaction. Therefore, he has the same risk of systemic reaction associated with the use Batrim as the general population.  - Do not give any Bactrim  for the next 24 hours. - Monitor for allergic symptoms such as rash, wheezing, diarrhea, swelling, and vomiting for the next 24 hours. If severe symptoms occur, call 911. For less severe symptoms treat with Benadryl 50 mg every 4 hours and call the clinic.   Call the clinic if this treatment plan is not working well for you  Follow up in 6 months or sooner if needed.

## 2023-11-14 ENCOUNTER — Other Ambulatory Visit: Payer: Self-pay

## 2023-11-14 ENCOUNTER — Encounter: Payer: Self-pay | Admitting: Family Medicine

## 2023-11-14 ENCOUNTER — Ambulatory Visit (INDEPENDENT_AMBULATORY_CARE_PROVIDER_SITE_OTHER): Payer: 59 | Admitting: Family Medicine

## 2023-11-14 VITALS — BP 130/80 | HR 79 | Temp 98.7°F | Resp 20 | Wt 193.7 lb

## 2023-11-14 DIAGNOSIS — H5213 Myopia, bilateral: Secondary | ICD-10-CM | POA: Diagnosis not present

## 2023-11-14 DIAGNOSIS — Z889 Allergy status to unspecified drugs, medicaments and biological substances status: Secondary | ICD-10-CM

## 2023-11-14 DIAGNOSIS — Z888 Allergy status to other drugs, medicaments and biological substances status: Secondary | ICD-10-CM

## 2023-11-14 DIAGNOSIS — F4323 Adjustment disorder with mixed anxiety and depressed mood: Secondary | ICD-10-CM | POA: Diagnosis not present

## 2023-11-21 ENCOUNTER — Encounter: Payer: Self-pay | Admitting: Family Medicine

## 2023-11-21 ENCOUNTER — Telehealth: Payer: Self-pay | Admitting: *Deleted

## 2023-11-21 NOTE — Telephone Encounter (Signed)
Called and left a voicemail asking for a return call to discuss.  ?

## 2023-11-21 NOTE — Telephone Encounter (Signed)
-----   Message from Thermon Leyland sent at 11/21/2023  3:26 PM EST ----- Can you please let this patient know that, for a recommendation for a local pediatrician, Dr. Dellis Anes suggested Dr. Jacinto Reap and Dr. Vernie Murders at [facility] as well as Dr. Erik Obey or Dr. Netta Cedars at [facility] Thank you!

## 2023-11-21 NOTE — Telephone Encounter (Signed)
Refer to patient's myChart message.

## 2023-11-28 ENCOUNTER — Ambulatory Visit: Payer: 59 | Admitting: Family Medicine

## 2023-12-10 NOTE — Progress Notes (Signed)
 [name] [address] [place] [address] Phone: [phone] Subjective:   [name], am serving as a scribe for Dr. Arthea Claudene.  I'm seeing this patient by the request  of:  Kennyth Worth HERO, MD  CC: Back and neck pain  [name]  Jason [name] is a 43 y.o. male coming in with complaint of back and neck pain. OMT 10/10/2023. Patient states that he made an ergonomic change at work using a different type of injection.   Notices hip tightness at night which he has not before. Denies any radiating symptoms.   Medications patient has been prescribed: None  Taking:         Reviewed prior external information including notes and imaging from previsou exam, outside providers and external EMR if available.   As well as notes that were available from care everywhere and other healthcare systems.  Past medical history, social, surgical and family history all reviewed in electronic medical record.  No pertanent information unless stated regarding to the chief complaint.   Past Medical History:  Diagnosis Date   ADHD (attention deficit hyperactivity disorder)    Allergy    Hypertension    Migraine    Nasal septal deviation     No Known Allergies   Review of Systems:  No headache, visual changes, nausea, vomiting, diarrhea, constipation, dizziness, abdominal pain, skin rash, fevers, chills, night sweats, weight loss, swollen lymph nodes, body aches, joint swelling, chest pain, shortness of breath, mood changes. POSITIVE muscle aches  Objective  Blood pressure (!) 132/98, pulse 69, height 5' 11 (1.803 m), weight 189 lb (85.7 kg), SpO2 99%.   General: No apparent distress alert and oriented x3 mood and affect normal, dressed appropriately.  HEENT: Pupils equal, extraocular movements intact  Respiratory: Patient's speak in full sentences and does not appear short of breath  Cardiovascular: No lower extremity edema, non  tender, no erythema  MSK:  Back low back lordosis.  There is still has tightness noted at the level of scapula on the right side.  Patient is a 42 tightness also at the occipital area bilaterally does have tightness noted in the scapular area as stated above.  Osteopathic findings  C3 flexed rotated and side bent right C5 flexed rotated and side bent left T2 extended rotated and side bent right inhaled rib T6 extended rotated and side bent right L2 flexed rotated and side bent right Sacrum right on right       Assessment and Plan:  Cervicogenic migraine Patient has been making significant remarkable improvement over the course of time.  Patient is today to be more active.  Has made significant ergonomic changes that has been helpful as well.  No other drastic changes are necessary at this time..  Could consider different medication changes if any exacerbation but otherwise follow-up again in 2 to 3 months    Nonallopathic problems  Decision today to treat with OMT was based on Physical Exam  After verbal consent patient was treated with HVLA, ME, FPR techniques in cervical, rib, thoracic, lumbar, and sacral  areas  Patient tolerated the procedure well with improvement in symptoms  Patient given exercises, stretches and lifestyle modifications  See medications in patient instructions if given  Patient will follow up in 4-8 weeks    The above documentation has been reviewed and is accurate and complete Regena Delucchi M Maira Christon, DO          Note: This dictation was  prepared with Dragon dictation along with smaller phrase technology. Any transcriptional errors that result from this process are unintentional.

## 2023-12-12 ENCOUNTER — Encounter: Payer: Self-pay | Admitting: Family Medicine

## 2023-12-12 ENCOUNTER — Ambulatory Visit: Payer: 59 | Admitting: Family Medicine

## 2023-12-12 ENCOUNTER — Other Ambulatory Visit (HOSPITAL_COMMUNITY): Payer: Self-pay

## 2023-12-12 ENCOUNTER — Ambulatory Visit (INDEPENDENT_AMBULATORY_CARE_PROVIDER_SITE_OTHER): Payer: 59 | Admitting: Family Medicine

## 2023-12-12 VITALS — BP 132/98 | HR 69 | Ht 71.0 in | Wt 189.0 lb

## 2023-12-12 VITALS — BP 130/92 | HR 84 | Ht 71.0 in | Wt 183.0 lb

## 2023-12-12 DIAGNOSIS — F909 Attention-deficit hyperactivity disorder, unspecified type: Secondary | ICD-10-CM

## 2023-12-12 DIAGNOSIS — E663 Overweight: Secondary | ICD-10-CM

## 2023-12-12 DIAGNOSIS — F4329 Adjustment disorder with other symptoms: Secondary | ICD-10-CM | POA: Diagnosis not present

## 2023-12-12 DIAGNOSIS — F4323 Adjustment disorder with mixed anxiety and depressed mood: Secondary | ICD-10-CM | POA: Diagnosis not present

## 2023-12-12 DIAGNOSIS — G43809 Other migraine, not intractable, without status migrainosus: Secondary | ICD-10-CM

## 2023-12-12 DIAGNOSIS — F4320 Adjustment disorder, unspecified: Secondary | ICD-10-CM | POA: Insufficient documentation

## 2023-12-12 DIAGNOSIS — I10 Essential (primary) hypertension: Secondary | ICD-10-CM | POA: Diagnosis not present

## 2023-12-12 MED ORDER — LOSARTAN POTASSIUM 50 MG PO TABS
25.0000 mg | ORAL_TABLET | Freq: Every day | ORAL | 3 refills | Status: AC
  Filled 2023-12-12: qty 45, 90d supply, fill #0
  Filled 2024-03-18: qty 45, 90d supply, fill #1
  Filled 2024-09-27: qty 45, 90d supply, fill #2

## 2023-12-12 NOTE — Assessment & Plan Note (Signed)
 Mildly elevated today.  He has had quite a bit of fluctuation in the last couple of months as well.  He believes this may be due to his prescription changing manufacturers.  We will go back to 50 mg tablet that he can split in half.  He will monitor at home and at work and check in with us  in a few weeks via MyChart.  We can titrate the dose as needed.

## 2023-12-12 NOTE — Assessment & Plan Note (Signed)
 On Vyvanse 40 mg daily.  Overall tolerating well.  Medications help with ability stay focused and on task.  Does not need refill today.  Follow-up in 3 to 6 months.

## 2023-12-12 NOTE — Assessment & Plan Note (Signed)
 Had lengthy discussion with patient regarding his increased anxiety over the last several weeks.  Unfortunately he has been under quite a bit of stress recently as well.  He is working with a therapist which does seem to be working well.  We discussed medication options however he has had issues with SSRIs in the past and would like to avoid any medication that works on serotonin receptors.  We also did discuss BuSpar however he would like to hold off on this for now.  Briefly discussed benzodiazepines as well.  He will try taking over-the-counter Unisom to help with sleep at night.  He will follow-up with us  in a few weeks.  Would consider very low-dose Valium  1 mg twice daily as needed for a couple of weeks if symptoms or not improving with above.  Discussed with patient this should not be a long-term prescription.  He is agreeable to this plan.

## 2023-12-12 NOTE — Patient Instructions (Signed)
 Exciting times ahead Good to see you See me again in 2 months

## 2023-12-12 NOTE — Assessment & Plan Note (Signed)
 BMI 25.5.  He has done well with semaglutide.  He will continue his current dose.  He has had some side effects with higher doses.  Follow-up again in 6 months.

## 2023-12-12 NOTE — Assessment & Plan Note (Signed)
 Patient has been making significant remarkable improvement over the course of time.  Patient is today to be more active.  Has made significant ergonomic changes that has been helpful as well.  No other drastic changes are necessary at this time..  Could consider different medication changes if any exacerbation but otherwise follow-up again in 2 to 3 months

## 2023-12-12 NOTE — Patient Instructions (Signed)
 It was very nice to see you today!  We will switch your losartan  to 50 mg daily.  Please let me know if you need any other assistance to help with your stress management.  See back in 6 months.  Come back sooner if needed.  Return in about 6 months (around 06/10/2024) for Annual Physical.   Take care, Dr Kennyth  PLEASE NOTE:  If you had any lab tests, please let us  know if you have not heard back within a few days. You may see your results on mychart before we have a chance to review them but we will give you a call once they are reviewed by us .   If we ordered any referrals today, please let us  know if you have not heard from their office within the next week.   If you had any urgent prescriptions sent in today, please check with the pharmacy within an hour of our visit to make sure the prescription was transmitted appropriately.   Please try these tips to maintain a healthy lifestyle:  Eat at least 3 REAL meals and 1-2 snacks per day.  Aim for no more than 5 hours between eating.  If you eat breakfast, please do so within one hour of getting up.   Each meal should contain half fruits/vegetables, one quarter protein, and one quarter carbs (no bigger than a computer mouse)  Cut down on sweet beverages. This includes juice, soda, and sweet tea.   Drink at least 1 glass of water with each meal and aim for at least 8 glasses per day  Exercise at least 150 minutes every week.
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
## 2023-12-24 ENCOUNTER — Other Ambulatory Visit (HOSPITAL_COMMUNITY): Payer: Self-pay

## 2023-12-24 ENCOUNTER — Other Ambulatory Visit: Payer: Self-pay | Admitting: Family Medicine

## 2023-12-24 MED ORDER — LISDEXAMFETAMINE DIMESYLATE 40 MG PO CAPS
40.0000 mg | ORAL_CAPSULE | ORAL | 0 refills | Status: AC
  Filled 2023-12-24: qty 30, 30d supply, fill #0

## 2023-12-30 ENCOUNTER — Encounter: Payer: Self-pay | Admitting: Family Medicine

## 2023-12-30 NOTE — Telephone Encounter (Signed)
HE can take twice daily as needed for a couple of weeks.  Katina Degree. Jimmey Ralph, MD 12/30/2023 3:22 PM

## 2023-12-30 NOTE — Telephone Encounter (Signed)
Please advise

## 2024-01-06 ENCOUNTER — Other Ambulatory Visit (HOSPITAL_COMMUNITY): Payer: Self-pay

## 2024-01-06 MED ORDER — DIAZEPAM 2 MG PO TABS
1.0000 mg | ORAL_TABLET | Freq: Two times a day (BID) | ORAL | 0 refills | Status: DC | PRN
  Filled 2024-01-06: qty 15, 15d supply, fill #0

## 2024-01-06 NOTE — Telephone Encounter (Signed)
 See note

## 2024-01-09 DIAGNOSIS — F4323 Adjustment disorder with mixed anxiety and depressed mood: Secondary | ICD-10-CM | POA: Diagnosis not present

## 2024-01-30 ENCOUNTER — Ambulatory Visit (INDEPENDENT_AMBULATORY_CARE_PROVIDER_SITE_OTHER): Payer: 59 | Admitting: Physician Assistant

## 2024-01-30 ENCOUNTER — Encounter: Payer: Self-pay | Admitting: Physician Assistant

## 2024-01-30 VITALS — BP 138/93 | HR 89 | Temp 98.4°F | Ht 71.0 in | Wt 187.6 lb

## 2024-01-30 DIAGNOSIS — F909 Attention-deficit hyperactivity disorder, unspecified type: Secondary | ICD-10-CM

## 2024-01-30 DIAGNOSIS — F4329 Adjustment disorder with other symptoms: Secondary | ICD-10-CM

## 2024-01-30 DIAGNOSIS — Z0184 Encounter for antibody response examination: Secondary | ICD-10-CM | POA: Diagnosis not present

## 2024-01-30 DIAGNOSIS — F4323 Adjustment disorder with mixed anxiety and depressed mood: Secondary | ICD-10-CM | POA: Diagnosis not present

## 2024-01-30 NOTE — Patient Instructions (Signed)
 It was great to see you!  As needed medications for anxiety: propranolol 10-20 mg three times daily as needed, buspar 5-15 mg three times daily as needed  Consider for sleep temazepam if Jimmey Ralph is amenable  Consider updating Tetanus, Diphtheria, and Pertussis (Tdap) soon  I will be in touch with results of titers  Take care,  Jarold Motto PA-C

## 2024-01-30 NOTE — Progress Notes (Signed)
 Jason [name] is a 43 y.o. male here for a follow up of a pre-existing problem.  History of Present Illness:   Chief Complaint  Patient presents with   Medication Consultation    Valium working better and helping with sleep notice Vyvance give him more anxiety     HPI  ADHD He is not really taking Vyvanse much anymore He has noticed that it causes worsening anxiety when he takes it and what seems most effective for his mental clarity is getting adequate sleep  Immunity status testing Having first baby soon Concerned about measles outbreak, interested in checking titers and getting booster if needed He is also interested in updating tetanus shot (last 5 years ago) closer to wife's delivery We will also update hepatitis B immunity status per his request  Anxiety Had some significant stress recently Was prescribed valium 1 mg twice daily and this was incredibly beneficial Was still able to work while taking medication, helped him sleep Doesn't know if he still needs medication  Past medications Citalopram -- first weeks were ok but then developed daily nausea/vomiting Wellbutrin - increased insomnia  Denies suicidal ideation/hi    Past Medical History:  Diagnosis Date   ADHD (attention deficit hyperactivity disorder)    Allergy    Hypertension    Migraine    Nasal septal deviation      Social History   Tobacco Use   Smoking status: Never    Passive exposure: Never   Smokeless tobacco: Never  Vaping Use   Vaping status: Never Used  Substance Use Topics   Alcohol use: Yes    Comment: Rarely   Drug use: Never    Past Surgical History:  Procedure Laterality Date   ADENOIDECTOMY     SALIVARY GLAND SURGERY     SEPTOPLASTY Bilateral 10/20/2023   Procedure: SEPTOPLASTY;  Surgeon: Serena Colonel, MD;  Location: [facility];  Service: ENT;  Laterality: Bilateral;   TONSILLECTOMY     TURBINATE REDUCTION Bilateral 10/20/2023   Procedure: TURBINATE  REDUCTION;  Surgeon: Serena Colonel, MD;  Location:  SURGERY CENTER;  Service: ENT;  Laterality: Bilateral;   WISDOM TOOTH EXTRACTION      Family History  Problem Relation Age of Onset   Allergic rhinitis Mother    Arthritis Mother    Hypertension Mother    Prostate cancer Father    Skin cancer Father    Cancer Father    Prostate cancer Paternal Uncle    Stroke Paternal Grandmother    Prostate cancer Paternal Grandfather    Stroke Paternal Grandfather    Parkinson's disease Paternal Grandfather     No Known Allergies  Current Medications:   Current Outpatient Medications:    diazepam (VALIUM) 2 MG tablet, Take 1/2 tablet (1 mg total) by mouth every 12 (twelve) hours as needed for anxiety., Disp: 15 tablet, Rfl: 0   Famotidine-Ca Carb-Mag Hydrox (PEPCID COMPLETE PO), Take by mouth., Disp: , Rfl:    HYDROcodone-acetaminophen (NORCO) 7.5-325 MG tablet, Take 1 tablet by mouth every 6 (six) hours as needed for moderate pain (pain score 4-6)., Disp: 30 tablet, Rfl: 0   lisdexamfetamine (VYVANSE) 40 MG capsule, Take 1 capsule (40 mg total) by mouth every morning., Disp: 30 capsule, Rfl: 0   losartan (COZAAR) 50 MG tablet, Take 0.5 tablets (25 mg total) by mouth daily., Disp: 45 tablet, Rfl: 3   Multiple Vitamin (MULTIVITAMIN ADULT PO), Take by mouth., Disp: , Rfl:    ondansetron (ZOFRAN-ODT)  4 MG disintegrating tablet, Take 1 tablet (4 mg total) by mouth every 8 (eight) hours as needed for nausea or vomiting., Disp: 20 tablet, Rfl: 0   Semaglutide, 1 MG/DOSE, 4 MG/3ML SOPN, Inject 1 mg as directed once a week., Disp: 3 mL, Rfl: 1   tiZANidine (ZANAFLEX) 4 MG tablet, Take 1 tablet (4 mg total) by mouth every 6 (six) hours as needed for muscle spasms., Disp: 30 tablet, Rfl: 0   Review of Systems:   Negative unless otherwise specified per HPI.  Vitals:   Vitals:   01/30/24 0741  BP: (!) 138/93  Pulse: 89  Temp: 98.4 F (36.9 C)  TempSrc: Temporal  Weight: 187 lb 9.6 oz  (85.1 kg)  Height: 5\' 11"  (1.803 m)     Body mass index is 26.16 kg/m.  Physical Exam:   Physical Exam Vitals and nursing note reviewed.  Constitutional:      General: He is not in acute distress.    Appearance: He is well-developed. He is not ill-appearing or toxic-appearing.  Cardiovascular:     Rate and Rhythm: Normal rate and regular rhythm.     Pulses: Normal pulses.     Heart sounds: Normal heart sounds, S1 normal and S2 normal.  Pulmonary:     Effort: Pulmonary effort is normal.     Breath sounds: Normal breath sounds.  Skin:    General: Skin is warm and dry.  Neurological:     Mental Status: He is alert.     GCS: GCS eye subscore is 4. GCS verbal subscore is 5. GCS motor subscore is 6.  Psychiatric:        Speech: Speech normal.        Behavior: Behavior normal. Behavior is cooperative.     Assessment and Plan:   1. Attention deficit hyperactivity disorder (ADHD), unspecified ADHD type (Primary) Overall controlled when getting adequate sleep May consider Vyvanse reduction to see if helps improve anxiety symptom(s) -- will defer to Primary Care Provider (PCP) Follow-up with Primary Care Provider (PCP) as regularly scheduled  2. Immunity status testing - Measles/Mumps/Rubella Immunity - Hepatitis B surface antibody,quantitative Will provide recommendations accordingly  3. Adjustment disorder with other symptom No red flags We discussed a few options today For anxiety: propranolol 10-20 mg three times daily as needed, buspar 5-15 mg three times daily as needed For sleep: consider temazepam if Primary Care Provider (PCP) is amenable vs other options Offered short-term refill of valium today -- declined Denies suicidal ideation/hi   Jarold Motto, PA-C

## 2024-01-31 LAB — MEASLES/MUMPS/RUBELLA IMMUNITY
Mumps IgG: <9 [AU]/ml — ABNORMAL LOW
Rubella: 11 {index}
Rubeola IgG: 99 [AU]/ml

## 2024-01-31 LAB — HEPATITIS B SURFACE ANTIBODY, QUANTITATIVE: Hep B S AB Quant (Post): 133 m[IU]/mL (ref >=10)

## 2024-02-01 ENCOUNTER — Encounter: Payer: Self-pay | Admitting: Physician Assistant

## 2024-02-02 NOTE — Telephone Encounter (Signed)
 Please call pt to schedule a Nurse Visit to get MMR booster.

## 2024-02-06 ENCOUNTER — Ambulatory Visit

## 2024-02-06 DIAGNOSIS — Z23 Encounter for immunization: Secondary | ICD-10-CM

## 2024-02-06 NOTE — Progress Notes (Signed)
 Patient is in office today for a nurse visit for  MMR , per PCP's order. Patient Injection was given in the  Right deltoid. Patient tolerated injection well.

## 2024-02-19 NOTE — Progress Notes (Unsigned)
 [name] [address] [place] [address] Phone: [phone] Subjective:   [name], am serving as a scribe for Dr. Antoine Primas.  I'm seeing this patient by the request  of:  [name], MD  CC: Back and neck pain follow-up  ION:[id number]  KARMAN VENEY is a 43 y.o. male coming in with complaint of back and neck pain. OMT 12/12/2023. Patient states that R side of his neck is painful and is tight in lumbarsacaral junction.   Medications patient has been prescribed: None  Taking:         Reviewed prior external information including notes and imaging from previsou exam, outside providers and external EMR if available.   As well as notes that were available from care everywhere and other healthcare systems.  Past medical history, social, surgical and family history all reviewed in electronic medical record.  No pertanent information unless stated regarding to the chief complaint.   Past Medical History:  Diagnosis Date   ADHD (attention deficit hyperactivity disorder)    Allergy    Hypertension    Migraine    Nasal septal deviation     No Known Allergies   Review of Systems:  No headache, visual changes, nausea, vomiting, diarrhea, constipation, dizziness, abdominal pain, skin rash, fevers, chills, night sweats, weight loss, swollen lymph nodes, body aches, joint swelling, chest pain, shortness of breath, mood changes. POSITIVE muscle aches  Objective  Blood pressure 122/88, pulse 95, height 5\' 11"  (1.803 m), weight 185 lb (83.9 kg), SpO2 99%.   General: No apparent distress alert and oriented x3 mood and affect normal, dressed appropriately.  HEENT: Pupils equal, extraocular movements intact  Respiratory: Patient's speak in full sentences and does not appear short of breath  Cardiovascular: No lower extremity edema, non tender, no erythema  Neck exam does have some loss lordosis noted.  Some tightness noted with  sidebending.  Tightness noted in the parascapular area. Osteopathic findings  C2 flexed rotated and side bent right C6 flexed rotated and side bent right T3 extended rotated and side bent right inhaled rib T9 extended rotated and side bent ankle: No visible erythema or swelling. Range of motion is full in all directions. Strength is 5/5 in all directions. Stable lateral and medial ligaments; squeeze test and kleiger test unremarkable; Talar dome nontender; No pain at base of 5th MT; No tenderness over cuboid; No tenderness over N spot or navicular prominence No tenderness on posterior aspects of lateral and medial malleolus No sign of peroneal tendon subluxations or tenderness to palpation Negative tarsal tunnel tinel's Able to walk 4 steps. L2 flexed rotated and side bent right Sacrum right on right       Assessment and Plan:  Cervicogenic migraine Cervicogenic headache still noted.  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  The patient has been a little more active recently.  Slowly as well.  Follow-up again in 6 to 8 weeks    Nonallopathic problems  Decision today to treat with OMT was based on Physical Exam  After verbal consent patient was treated with HVLA, ME, FPR techniques in cervical, rib, thoracic, lumbar, and sacral  areas  Patient tolerated the procedure well with improvement in symptoms  Patient given exercises, stretches and lifestyle modifications  See medications in patient instructions if given  Patient will follow up in 4-8 weeks     The above documentation has been reviewed and is accurate and complete Clifton Custard  Katrinka Blazing, DO         Note: This dictation was prepared with Dragon dictation along with smaller phrase technology. Any transcriptional errors that result from this process are unintentional.

## 2024-02-20 ENCOUNTER — Ambulatory Visit: Payer: 59 | Admitting: Family Medicine

## 2024-02-20 ENCOUNTER — Encounter: Payer: Self-pay | Admitting: Family Medicine

## 2024-02-20 VITALS — BP 122/88 | HR 95 | Ht 71.0 in | Wt 185.0 lb

## 2024-02-20 DIAGNOSIS — M9901 Segmental and somatic dysfunction of cervical region: Secondary | ICD-10-CM | POA: Diagnosis not present

## 2024-02-20 DIAGNOSIS — F4323 Adjustment disorder with mixed anxiety and depressed mood: Secondary | ICD-10-CM | POA: Diagnosis not present

## 2024-02-20 DIAGNOSIS — M9902 Segmental and somatic dysfunction of thoracic region: Secondary | ICD-10-CM

## 2024-02-20 DIAGNOSIS — G43809 Other migraine, not intractable, without status migrainosus: Secondary | ICD-10-CM

## 2024-02-20 DIAGNOSIS — M9908 Segmental and somatic dysfunction of rib cage: Secondary | ICD-10-CM | POA: Diagnosis not present

## 2024-02-20 DIAGNOSIS — M9903 Segmental and somatic dysfunction of lumbar region: Secondary | ICD-10-CM

## 2024-02-20 DIAGNOSIS — M9904 Segmental and somatic dysfunction of sacral region: Secondary | ICD-10-CM

## 2024-02-20 NOTE — Assessment & Plan Note (Signed)
 Cervicogenic headache still noted.  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  The patient has been a little more active recently.  Slowly as well.  Follow-up again in 6 to 8 weeks

## 2024-02-20 NOTE — Patient Instructions (Signed)
 Good to see you! Let's get you seen at end of April before you have no more free time

## 2024-03-18 ENCOUNTER — Encounter: Payer: Self-pay | Admitting: Family Medicine

## 2024-03-18 NOTE — Telephone Encounter (Signed)
 Can we have Jason [name] come in to discuss? We can also update his vaccines at that point as well.  [name]. [name], MD 03/18/2024 4:22 PM

## 2024-03-18 NOTE — Telephone Encounter (Signed)
 Please see pt msg and advise

## 2024-03-19 DIAGNOSIS — F4323 Adjustment disorder with mixed anxiety and depressed mood: Secondary | ICD-10-CM | POA: Diagnosis not present

## 2024-03-22 NOTE — Telephone Encounter (Signed)
 Please schedule a office visit with Dr [name] for medication question

## 2024-03-23 ENCOUNTER — Other Ambulatory Visit: Payer: Self-pay | Admitting: *Deleted

## 2024-03-23 ENCOUNTER — Telehealth: Payer: Self-pay | Admitting: *Deleted

## 2024-03-23 ENCOUNTER — Encounter: Payer: Self-pay | Admitting: Family Medicine

## 2024-03-23 MED ORDER — SEMAGLUTIDE (1 MG/DOSE) 4 MG/3ML ~~LOC~~ SOPN: 1.0000 mg | PEN_INJECTOR | SUBCUTANEOUS | 1 refills | Status: DC

## 2024-03-23 NOTE — Telephone Encounter (Signed)
 Copied from CRM [id number]. Topic: Clinical - Prescription Issue >> Mar 23, 2024  1:43 PM Trula Gable C wrote: Reason for CRM: [facility] called in regarding patient prescription Semaglutide , 1 MG/DOSE, 4 MG/3ML SOPN  auto inject - needs to be written for  the compound formula - needs it to be corrected and sent back by the end of the day today  sent over via fax a prescription   Please advise  Michoel Kunin,RMA

## 2024-03-23 NOTE — Telephone Encounter (Signed)
 Noted.

## 2024-03-24 NOTE — Telephone Encounter (Signed)
**Note De-identified  [place] Obfuscation** Please advise 

## 2024-03-24 NOTE — Telephone Encounter (Signed)
 Ok with me. Please place any necessary orders.

## 2024-03-24 NOTE — Telephone Encounter (Signed)
 Please send Rx to [facility]

## 2024-03-25 ENCOUNTER — Encounter: Payer: Self-pay | Admitting: *Deleted

## 2024-03-25 NOTE — Telephone Encounter (Signed)
 We can try Zepbound through [facility]. Some insurances will also pay for this if he has a diagnosis of OSA.   [name]. [name], MD 03/25/2024 12:38 PM

## 2024-03-25 NOTE — Telephone Encounter (Signed)
 Spoke with patient,stated will talk to the pharmacy for price first

## 2024-03-25 NOTE — Telephone Encounter (Signed)
 See previews note

## 2024-03-25 NOTE — Telephone Encounter (Signed)
 Please see MyChart message.  It appears as if compounding pharmacy would not work.  We can send in Zepbound through [facility] if he wishes.

## 2024-03-30 NOTE — Telephone Encounter (Signed)
Pt called back and would like for you to call him back

## 2024-03-30 NOTE — Telephone Encounter (Signed)
 Left message to return call to our office at their convenience.

## 2024-04-02 ENCOUNTER — Ambulatory Visit: Admitting: Family Medicine

## 2024-04-17 ENCOUNTER — Encounter: Payer: Self-pay | Admitting: Family Medicine

## 2024-04-23 ENCOUNTER — Ambulatory Visit (INDEPENDENT_AMBULATORY_CARE_PROVIDER_SITE_OTHER)

## 2024-04-23 DIAGNOSIS — M5030 Other cervical disc degeneration, unspecified cervical region: Secondary | ICD-10-CM | POA: Diagnosis not present

## 2024-04-23 MED ORDER — METHYLPREDNISOLONE ACETATE 80 MG/ML IJ SUSP
80.0000 mg | Freq: Once | INTRAMUSCULAR | Status: AC
  Administered 2024-04-23: 80 mg via INTRAMUSCULAR

## 2024-04-23 MED ORDER — KETOROLAC TROMETHAMINE 30 MG/ML IJ SOLN
60.0000 mg | Freq: Once | INTRAMUSCULAR | Status: AC
Start: 2024-04-23 — End: 2024-04-23
  Administered 2024-04-23: 60 mg via INTRAMUSCULAR

## 2024-04-23 NOTE — Progress Notes (Signed)
 Patient received Toradol  60 mg in left buttocks and Methylprednisolone  80 mg in right buttocks. Patient tolerated well.

## 2024-04-29 NOTE — Progress Notes (Unsigned)
 [facility] [address] [place] [address] Phone: [phone] Subjective:   Jason [name], am serving as a scribe for Dr. Ronnell Coins.  I'Jason seeing this patient by the request  of:  Rodney Clamp, MD  CC: Acute exacerbation of the neck  [id number]  Jason [name] is a 43 y.o. male coming in with complaint of back and neck pain. OMT 02/20/2024.  Has had an exacerbation since his kids been born.  Came in on the 23rd for Toradol and Depo-Medrol injections.  Patient states that he has pain in R scapula is spasming. Pain is constant.   Also having L numbness in great toe for a couple of weeks.   Also experiencing cold sensation in the R groin that travels into the R knee. Worse with back extension.   Medications patient has been prescribed: None  Taking:         Reviewed prior external information including notes and imaging from previsou exam, outside providers and external EMR if available.   As well as notes that were available from care everywhere and other healthcare systems.  Past medical history, social, surgical and family history all reviewed in electronic medical record.  No pertanent information unless stated regarding to the chief complaint.   Past Medical History:  Diagnosis Date   ADHD (attention deficit hyperactivity disorder)    Allergy    Hypertension    Migraine    Nasal septal deviation     No Known Allergies   Review of Systems:  No headache, visual changes, nausea, vomiting, diarrhea, constipation, dizziness, abdominal pain, skin rash, fevers, chills, night sweats, weight loss, swollen lymph nodes, body aches, joint swelling, chest pain, shortness of breath, mood changes. POSITIVE muscle aches  Objective  Blood pressure 132/88, pulse 88, height 5\' 11"  (1.803 Jason), weight 190 lb (86.2 kg), SpO2 98%.   General: No apparent distress alert and oriented x3 mood and affect normal, dressed appropriately.   HEENT: Pupils equal, extraocular movements intact  Respiratory: Patient's speak in full sentences and does not appear short of breath  Cardiovascular: No lower extremity edema, non tender, no erythema  Neck exam has significant tightness noted on the right side.  Multiple trigger points in the trapezius, rhomboid and levator scapula on the right side.  Osteopathic findings  C2 flexed rotated and side bent right C6 flexed rotated and side bent right T3 extended rotated and side bent right inhaled rib T9 extended rotated and side bent left L2 flexed rotated and side bent right Sacrum right on right   After verbal consent patient was prepped with alcohol swab and with a 25-gauge half inch needle injected into 3 distinct trigger points in the right shoulder region including the US , rhomboid, and levator scapula.  Total of 3 cc of 0.5% Marcaine  and 1 cc of Kenalog 40 mg used.  Minimal blood loss.  Band-Aids placed.  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  Discussed icing regimen of home exercises, discussed which activities to do and which ones to avoid.  Increase activity slowly.  Discussed icing regimen follow-up again in 6 to 8 weeks otherwise.  Cervicogenic migraine Continue to contribute as  well.  Will need to continue to monitor.  We discussed icing regimen and home exercises.  Increase activity slowly.  Follow-up again in 6 to 8 weeks    Nonallopathic problems  Decision today to treat with OMT was based on Physical Exam  After verbal consent patient was treated with HVLA, ME, FPR techniques in cervical, rib, thoracic, lumbar, and sacral  areas  Patient tolerated the procedure well with improvement in  symptoms  Patient given exercises, stretches and lifestyle modifications  See medications in patient instructions if given  Patient will follow up in 4-8 weeks    The above documentation has been reviewed and is accurate and complete Jason [name] Jason Severus Brodzinski, DO          Note: This dictation was prepared with Dragon dictation along with smaller phrase technology. Any transcriptional errors that result from this process are unintentional.

## 2024-04-30 ENCOUNTER — Ambulatory Visit: Payer: Self-pay | Admitting: Family Medicine

## 2024-04-30 ENCOUNTER — Ambulatory Visit: Admitting: Family Medicine

## 2024-04-30 ENCOUNTER — Ambulatory Visit (INDEPENDENT_AMBULATORY_CARE_PROVIDER_SITE_OTHER)

## 2024-04-30 ENCOUNTER — Other Ambulatory Visit: Payer: Self-pay

## 2024-04-30 ENCOUNTER — Other Ambulatory Visit (HOSPITAL_COMMUNITY): Payer: Self-pay

## 2024-04-30 ENCOUNTER — Encounter: Payer: Self-pay | Admitting: Family Medicine

## 2024-04-30 VITALS — BP 132/88 | HR 88 | Ht 71.0 in | Wt 190.0 lb

## 2024-04-30 DIAGNOSIS — M9904 Segmental and somatic dysfunction of sacral region: Secondary | ICD-10-CM

## 2024-04-30 DIAGNOSIS — M2550 Pain in unspecified joint: Secondary | ICD-10-CM

## 2024-04-30 DIAGNOSIS — M542 Cervicalgia: Secondary | ICD-10-CM

## 2024-04-30 DIAGNOSIS — M9902 Segmental and somatic dysfunction of thoracic region: Secondary | ICD-10-CM | POA: Diagnosis not present

## 2024-04-30 DIAGNOSIS — M25511 Pain in right shoulder: Secondary | ICD-10-CM | POA: Diagnosis not present

## 2024-04-30 DIAGNOSIS — F4323 Adjustment disorder with mixed anxiety and depressed mood: Secondary | ICD-10-CM | POA: Diagnosis not present

## 2024-04-30 DIAGNOSIS — M9908 Segmental and somatic dysfunction of rib cage: Secondary | ICD-10-CM | POA: Diagnosis not present

## 2024-04-30 DIAGNOSIS — G43809 Other migraine, not intractable, without status migrainosus: Secondary | ICD-10-CM

## 2024-04-30 DIAGNOSIS — M9903 Segmental and somatic dysfunction of lumbar region: Secondary | ICD-10-CM | POA: Diagnosis not present

## 2024-04-30 DIAGNOSIS — M545 Low back pain, unspecified: Secondary | ICD-10-CM | POA: Diagnosis not present

## 2024-04-30 DIAGNOSIS — M47816 Spondylosis without myelopathy or radiculopathy, lumbar region: Secondary | ICD-10-CM | POA: Diagnosis not present

## 2024-04-30 DIAGNOSIS — M549 Dorsalgia, unspecified: Secondary | ICD-10-CM | POA: Diagnosis not present

## 2024-04-30 DIAGNOSIS — M9901 Segmental and somatic dysfunction of cervical region: Secondary | ICD-10-CM | POA: Diagnosis not present

## 2024-04-30 DIAGNOSIS — M47812 Spondylosis without myelopathy or radiculopathy, cervical region: Secondary | ICD-10-CM | POA: Diagnosis not present

## 2024-04-30 LAB — TESTOSTERONE: Testosterone: 223.16 ng/dL — ABNORMAL LOW (ref 300.00–890.00)

## 2024-04-30 LAB — COMPREHENSIVE METABOLIC PANEL WITH GFR
ALT: 33 U/L (ref 0–53)
AST: 17 U/L (ref 0–37)
Albumin: 4.9 g/dL (ref 3.5–5.2)
Alkaline Phosphatase: 51 U/L (ref 39–117)
BUN: 22 mg/dL (ref 6–23)
CO2: 28 meq/L (ref 19–32)
Calcium: 9.5 mg/dL (ref 8.4–10.5)
Chloride: 104 meq/L (ref 96–112)
Creatinine, Ser: 1.2 mg/dL (ref 0.40–1.50)
GFR: 75.59 mL/min (ref >60.00)
Glucose, Bld: 73 mg/dL (ref 70–99)
Potassium: 3.7 meq/L (ref 3.5–5.1)
Sodium: 142 meq/L (ref 135–145)
Total Bilirubin: 2 mg/dL — ABNORMAL HIGH (ref 0.2–1.2)
Total Protein: 7.3 g/dL (ref 6.0–8.3)

## 2024-04-30 LAB — CBC WITH DIFFERENTIAL/PLATELET
Basophils Absolute: 0 10*3/uL (ref 0.0–0.1)
Basophils Relative: 0.7 % (ref 0.0–3.0)
Eosinophils Absolute: 0.1 10*3/uL (ref 0.0–0.7)
Eosinophils Relative: 1.7 % (ref 0.0–5.0)
HCT: 44.1 % (ref 39.0–52.0)
Hemoglobin: 14.9 g/dL (ref 13.0–17.0)
Lymphocytes Relative: 26.9 % (ref 12.0–46.0)
Lymphs Abs: 1.9 10*3/uL (ref 0.7–4.0)
MCHC: 33.7 g/dL (ref 30.0–36.0)
MCV: 87.3 fl (ref 78.0–100.0)
Monocytes Absolute: 0.6 10*3/uL (ref 0.1–1.0)
Monocytes Relative: 8.8 % (ref 3.0–12.0)
Neutro Abs: 4.3 10*3/uL (ref 1.4–7.7)
Neutrophils Relative %: 61.9 % (ref 43.0–77.0)
Platelets: 277 10*3/uL (ref 150.0–400.0)
RBC: 5.05 Mil/uL (ref 4.22–5.81)
RDW: 13.4 % (ref 11.5–15.5)
WBC: 6.9 10*3/uL (ref 4.0–10.5)

## 2024-04-30 LAB — C-REACTIVE PROTEIN: CRP: <1 mg/dL (ref 0.5–20.0)

## 2024-04-30 LAB — IBC PANEL
Iron: 112 ug/dL (ref 42–165)
Saturation Ratios: 30.9 % (ref 20.0–50.0)
TIBC: 362.6 ug/dL (ref 250.0–450.0)
Transferrin: 259 mg/dL (ref 212.0–360.0)

## 2024-04-30 LAB — SEDIMENTATION RATE: Sed Rate: 4 mm/h (ref 0–15)

## 2024-04-30 LAB — URIC ACID: Uric Acid, Serum: 4.1 mg/dL (ref 4.0–7.8)

## 2024-04-30 LAB — TSH: TSH: 1.84 u[IU]/mL (ref 0.35–5.50)

## 2024-04-30 LAB — VITAMIN D 25 HYDROXY (VIT D DEFICIENCY, FRACTURES): VITD: 25.9 ng/mL — ABNORMAL LOW (ref 30.00–100.00)

## 2024-04-30 LAB — FERRITIN: Ferritin: 68.5 ng/mL (ref 22.0–322.0)

## 2024-04-30 LAB — VITAMIN B12: Vitamin B-12: 305 pg/mL (ref 211–911)

## 2024-04-30 MED ORDER — VITAMIN D (ERGOCALCIFEROL) 1.25 MG (50000 UNIT) PO CAPS
50000.0000 [IU] | ORAL_CAPSULE | ORAL | 0 refills | Status: AC
  Filled 2024-04-30: qty 12, 84d supply, fill #0

## 2024-04-30 NOTE — Assessment & Plan Note (Signed)
 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  Discussed icing regimen of home exercises, discussed which activities to do and which ones to avoid.  Increase activity slowly.  Discussed icing regimen follow-up again in 6 to 8 weeks otherwise.

## 2024-04-30 NOTE — Patient Instructions (Addendum)
 Lab and Xray today Trigger point injections today Good to see you! See you again in 4 weeks

## 2024-04-30 NOTE — Assessment & Plan Note (Signed)
 Continue to contribute as well.  Will need to continue to monitor.  We discussed icing regimen and home exercises.  Increase activity slowly.  Follow-up again in 6 to 8 weeks

## 2024-05-03 LAB — LIPID PANEL
Cholesterol: 202 mg/dL — ABNORMAL HIGH (ref <200)
HDL: 72 mg/dL (ref >=40)
LDL Cholesterol (Calc): 107 mg/dL — ABNORMAL HIGH
Non-HDL Cholesterol (Calc): 130 mg/dL — ABNORMAL HIGH (ref <130)
Total CHOL/HDL Ratio: 2.8 (calc) (ref <5.0)
Triglycerides: 119 mg/dL (ref <150)

## 2024-05-03 LAB — CALCIUM, IONIZED: Calcium, Ion: 5.1 mg/dL (ref 4.7–5.5)

## 2024-05-03 LAB — PTH, INTACT AND CALCIUM
Calcium: 9.8 mg/dL (ref 8.6–10.3)
PTH: 13 pg/mL — ABNORMAL LOW (ref 16–77)

## 2024-05-03 LAB — ANA: Anti Nuclear Antibody (ANA): NEGATIVE

## 2024-05-03 LAB — ANGIOTENSIN CONVERTING ENZYME: Angiotensin-Converting Enzyme: 34 U/L (ref 9–67)

## 2024-05-03 LAB — CYCLIC CITRUL PEPTIDE ANTIBODY, IGG: Cyclic Citrullin Peptide Ab: <16 U

## 2024-05-03 LAB — RHEUMATOID FACTOR: Rheumatoid fact SerPl-aCnc: <10 [IU]/mL (ref <14)

## 2024-05-26 NOTE — Progress Notes (Signed)
 [name] [address] [place] [address] Phone: [phone] Subjective:    I'm seeing this patient by the request  of:  Kennyth Worth HERO, MD  CC: back and neck pain follow up   [name]  Jason [name] is a 43 y.o. male coming in with complaint of back and neck pain. OMT 04/30/2024.Patient states he is a little better. Pain has moved down to the thoracic / lumbar area but pain isnt as intense   Medications patient has been prescribed: Vit D  Taking:         Reviewed prior external information including notes and imaging from previsou exam, outside providers and external EMR if available.   As well as notes that were available from care everywhere and other healthcare systems.  Past medical history, social, surgical and family history all reviewed in electronic medical record.  No pertanent information unless stated regarding to the chief complaint.   Past Medical History:  Diagnosis Date   ADHD (attention deficit hyperactivity disorder)    Allergy    Hypertension    Migraine    Nasal septal deviation     No Known Allergies   Review of Systems:  No headache, visual changes, nausea, vomiting, diarrhea, constipation, dizziness, abdominal pain, skin rash, fevers, chills, night sweats, weight loss, swollen lymph nodes, body aches, joint swelling, chest pain, shortness of breath, mood changes. POSITIVE muscle aches  Objective  Blood pressure 130/82, pulse 75, height 5' 11 (1.803 m), weight 190 lb (86.2 kg), SpO2 99%.   General: No apparent distress alert and oriented x3 mood and affect normal, dressed appropriately.  HEENT: Pupils equal, extraocular movements intact  Respiratory: Patient's speak in full sentences and does not appear short of breath  Cardiovascular: No lower extremity edema, non tender, no erythema  Eczema-like changes on the elbows. Gait MSK:  Back does have tightness noted.  Seems to be in the paraspinal  musculature.  Patient does have difficulty with sidebending of the neck to the right.  Still some tightness noted on the right side of the neck.  Osteopathic findings  C2 flexed rotated and side bent right C6 flexed rotated and side bent right T3 extended rotated and side bent right inhaled rib T9 extended rotated and side bent left T11 extended rotated and side bent right L2 flexed rotated and side bent right Sacrum right on right       Assessment and Plan:  Cervicogenic migraine Discussed HEP Patient has had significant amount of stress and is not sleeping in his own bed on a regular basis.  Do think that this is contributing to more of a discomfort and pain.  Discussed icing regimen and home exercises otherwise.  Patient can have refills of any of the medications needed.  Follow-up again in 6 to 8 weeks.    Nonallopathic problems  Decision today to treat with OMT was based on Physical Exam  After verbal consent patient was treated with HVLA, ME, FPR techniques in cervical, rib, thoracic, lumbar, and sacral  areas  Patient tolerated the procedure well with improvement in symptoms  Patient given exercises, stretches and lifestyle modifications  See medications in patient instructions if given  Patient will follow up in 4-8 weeks      The above documentation has been reviewed and is accurate and complete Eulonda Andalon M Kedrick Mcnamee, DO        Note: This dictation was prepared with Dragon dictation along with smaller phrase technology. Any  transcriptional errors that result from this process are unintentional.

## 2024-05-28 ENCOUNTER — Ambulatory Visit (INDEPENDENT_AMBULATORY_CARE_PROVIDER_SITE_OTHER): Admitting: Family Medicine

## 2024-05-28 ENCOUNTER — Encounter: Payer: Self-pay | Admitting: Family Medicine

## 2024-05-28 ENCOUNTER — Other Ambulatory Visit (HOSPITAL_COMMUNITY): Payer: Self-pay

## 2024-05-28 VITALS — BP 130/82 | HR 75 | Ht 71.0 in | Wt 190.0 lb

## 2024-05-28 DIAGNOSIS — L249 Irritant contact dermatitis, unspecified cause: Secondary | ICD-10-CM

## 2024-05-28 DIAGNOSIS — M9902 Segmental and somatic dysfunction of thoracic region: Secondary | ICD-10-CM

## 2024-05-28 DIAGNOSIS — M9908 Segmental and somatic dysfunction of rib cage: Secondary | ICD-10-CM

## 2024-05-28 DIAGNOSIS — M9901 Segmental and somatic dysfunction of cervical region: Secondary | ICD-10-CM

## 2024-05-28 DIAGNOSIS — G43809 Other migraine, not intractable, without status migrainosus: Secondary | ICD-10-CM | POA: Diagnosis not present

## 2024-05-28 DIAGNOSIS — L209 Atopic dermatitis, unspecified: Secondary | ICD-10-CM | POA: Insufficient documentation

## 2024-05-28 DIAGNOSIS — M9903 Segmental and somatic dysfunction of lumbar region: Secondary | ICD-10-CM

## 2024-05-28 DIAGNOSIS — F4323 Adjustment disorder with mixed anxiety and depressed mood: Secondary | ICD-10-CM | POA: Diagnosis not present

## 2024-05-28 DIAGNOSIS — M9904 Segmental and somatic dysfunction of sacral region: Secondary | ICD-10-CM

## 2024-05-28 DIAGNOSIS — L259 Unspecified contact dermatitis, unspecified cause: Secondary | ICD-10-CM | POA: Insufficient documentation

## 2024-05-28 MED ORDER — TRIAMCINOLONE ACETONIDE 0.5 % EX CREA
1.0000 | TOPICAL_CREAM | Freq: Two times a day (BID) | CUTANEOUS | 3 refills | Status: AC
Start: 2024-05-28 — End: ?
  Filled 2024-05-28: qty 30, 15d supply, fill #0

## 2024-05-28 NOTE — Assessment & Plan Note (Signed)
 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  Triamcinolone  cream given.

## 2024-05-28 NOTE — Assessment & Plan Note (Signed)
 Discussed HEP Patient has had significant amount of stress and is not sleeping in his own bed on a regular basis.  Do think that this is contributing to more of a discomfort and pain.  Discussed icing regimen and home exercises otherwise.  Patient can have refills of any of the medications needed.  Follow-up again in 6 to 8 weeks.

## 2024-05-28 NOTE — Patient Instructions (Signed)
 Here if you need anything  Triamcinolone  cream  2 months

## 2024-06-11 ENCOUNTER — Ambulatory Visit (INDEPENDENT_AMBULATORY_CARE_PROVIDER_SITE_OTHER): Payer: 59 | Admitting: Family Medicine

## 2024-06-11 VITALS — BP 130/89 | HR 85 | Temp 97.4°F | Ht 71.0 in | Wt 189.4 lb

## 2024-06-11 DIAGNOSIS — F4329 Adjustment disorder with other symptoms: Secondary | ICD-10-CM

## 2024-06-11 DIAGNOSIS — I10 Essential (primary) hypertension: Secondary | ICD-10-CM

## 2024-06-11 DIAGNOSIS — E663 Overweight: Secondary | ICD-10-CM | POA: Diagnosis not present

## 2024-06-11 DIAGNOSIS — Z0001 Encounter for general adult medical examination with abnormal findings: Secondary | ICD-10-CM

## 2024-06-11 MED ORDER — CLOBETASOL PROPIONATE 0.05 % EX OINT: 1.0000 | TOPICAL_OINTMENT | Freq: Two times a day (BID) | CUTANEOUS | 0 refills | Status: AC

## 2024-06-11 MED ORDER — PERMETHRIN 5 % EX CREA: 1.0000 | TOPICAL_CREAM | Freq: Once | CUTANEOUS | 0 refills | Status: AC

## 2024-06-11 MED ORDER — BUPROPION HCL ER (XL) 150 MG PO TB24
150.0000 mg | ORAL_TABLET | Freq: Every day | ORAL | 3 refills | Status: DC
  Filled 2024-08-09: qty 90, 90d supply, fill #0

## 2024-06-11 MED ORDER — ZEPBOUND 2.5 MG/0.5ML ~~LOC~~ SOAJ: 2.5000 mg | SUBCUTANEOUS | 0 refills | Status: AC

## 2024-06-11 NOTE — Patient Instructions (Addendum)
 It was very nice to see you today!  I will send in a prescription for Wellbutrin .   I will also send prescription in for clobetasol .  Please use the permethrin .  We can recheck your testosterone  in a couple of weeks.  Please send me a message in a few weeks to let me know how you are doing we can adjust medications as needed.  Return in about 1 year (around 06/11/2025) for Annual Physical.   Take care, Dr Kennyth  PLEASE NOTE:  If you had any lab tests, please let us  know if you have not heard back within a few days. You may see your results on mychart before we have a chance to review them but we will give you a call once they are reviewed by us .   If we ordered any referrals today, please let us  know if you have not heard from their office within the next week.   If you had any urgent prescriptions sent in today, please check with the pharmacy within an hour of our visit to make sure the prescription was transmitted appropriately.   Please try these tips to maintain a healthy lifestyle:  Eat at least 3 REAL meals and 1-2 snacks per day.  Aim for no more than 5 hours between eating.  If you eat breakfast, please do so within one hour of getting up.   Each meal should contain half fruits/vegetables, one quarter protein, and one quarter carbs (no bigger than a computer mouse)  Cut down on sweet beverages. This includes juice, soda, and sweet tea.   Drink at least 1 glass of water with each meal and aim for at least 8 glasses per day  Exercise at least 150 minutes every week.    Preventive Care 5-14 Years Old, Male Preventive care refers to lifestyle choices and visits with your health care provider that can promote health and wellness. Preventive care visits are also called wellness exams. What can I expect for my preventive care visit? Counseling During your preventive care visit, your health care provider may ask about your: Medical history, including: Past medical  problems. Family medical history. Current health, including: Emotional well-being. Home life and relationship well-being. Sexual activity. Lifestyle, including: Alcohol, nicotine or tobacco, and drug use. Access to firearms. Diet, exercise, and sleep habits. Safety issues such as seatbelt and bike helmet use. Sunscreen use. Work and work Astronomer. Physical exam Your health care provider will check your: Height and weight. These may be used to calculate your BMI (body mass index). BMI is a measurement that tells if you are at a healthy weight. Waist circumference. This measures the distance around your waistline. This measurement also tells if you are at a healthy weight and may help predict your risk of certain diseases, such as type 2 diabetes and high blood pressure. Heart rate and blood pressure. Body temperature. Skin for abnormal spots. What immunizations do I need?  Vaccines are usually given at various ages, according to a schedule. Your health care provider will recommend vaccines for you based on your age, medical history, and lifestyle or other factors, such as travel or where you work. What tests do I need? Screening Your health care provider may recommend screening tests for certain conditions. This may include: Lipid and cholesterol levels. Diabetes screening. This is done by checking your blood sugar (glucose) after you have not eaten for a while (fasting). Hepatitis B test. Hepatitis C test. HIV (human immunodeficiency virus) test. STI (sexually transmitted  infection) testing, if you are at risk. Lung cancer screening. Prostate cancer screening. Colorectal cancer screening. Talk with your health care provider about your test results, treatment options, and if necessary, the need for more tests. Follow these instructions at home: Eating and drinking  Eat a diet that includes fresh fruits and vegetables, whole grains, lean protein, and low-fat dairy  products. Take vitamin and mineral supplements as recommended by your health care provider. Do not drink alcohol if your health care provider tells you not to drink. If you drink alcohol: Limit how much you have to 0-2 drinks a day. Know how much alcohol is in your drink. In the [place] one drink equals one 12 oz bottle of beer (355 mL), one 5 oz glass of wine (148 mL), or one 1 oz glass of hard liquor (44 mL). Lifestyle Brush your teeth every morning and night with fluoride toothpaste. Floss one time each day. Exercise for at least 30 minutes 5 or more days each week. Do not use any products that contain nicotine or tobacco. These products include cigarettes, chewing tobacco, and vaping devices, such as e-cigarettes. If you need help quitting, ask your health care provider. Do not use drugs. If you are sexually active, practice safe sex. Use a condom or other form of protection to prevent STIs. Take aspirin only as told by your health care provider. Make sure that you understand how much to take and what form to take. Work with your health care provider to find out whether it is safe and beneficial for you to take aspirin daily. Find healthy ways to manage stress, such as: Meditation, yoga, or listening to music. Journaling. Talking to a trusted person. Spending time with friends and family. Minimize exposure to UV radiation to reduce your risk of skin cancer. Safety Always wear your seat belt while driving or riding in a vehicle. Do not drive: If you have been drinking alcohol. Do not ride with someone who has been drinking. When you are tired or distracted. While texting. If you have been using any mind-altering substances or drugs. Wear a helmet and other protective equipment during sports activities. If you have firearms in your house, make sure you follow all gun safety procedures. What's next? Go to your health care provider once a year for an annual wellness visit. Ask your  health care provider how often you should have your eyes and teeth checked. Stay up to date on all vaccines. This information is not intended to replace advice given to you by your health care provider. Make sure you discuss any questions you have with your health care provider. Document Revised: 05/16/2021 Document Reviewed: 05/16/2021 Elsevier Patient Education  2024 [employer].

## 2024-06-11 NOTE — Assessment & Plan Note (Signed)
 Had a lengthy discussion with patient today regarding his increased anxiety and stress over the last several weeks.  His son who is 1 weeks old has been admitted to the hospital for the last 6 weeks due to ischemic bowel of unclear etiology.  This has been an immense source of stress for him and his wife.  He has established with a therapist.  We did discuss medication management.  He has not done well with SSRIs in the past however is interested in Wellbutrin  as this seems to have helped in the past.  Will restart this today and he will follow-up with us  in a few weeks.

## 2024-06-11 NOTE — Progress Notes (Signed)
 Chief Complaint:  Jason [name] is a 43 y.o. male who presents today for his annual comprehensive physical exam.    Assessment/Plan:  New/Acute Problems: Rash  Concern for contact dermis or atopic dermatitis related to recent lengthy stays in the hospital while he has been staying with his son who is currently admitted due to complications from ischemic bowel.  He did not have much improvement with triamcinolone .  Will try topical clobetasol .  We also did discuss that this may be scabies infection as well.  Will empirically treat with permethrin  application.  He will let us  know if not improving in the next 1 to 2 weeks.  Chronic Problems Addressed Today: Adjustment disorder Had a lengthy discussion with patient today regarding his increased anxiety and stress over the last several weeks.  His son who is 38 weeks old has been admitted to the hospital for the last 6 weeks due to ischemic bowel of unclear etiology.  This has been an immense source of stress for him and his wife.  He has established with a therapist.  We did discuss medication management.  He has not done well with SSRIs in the past however is interested in Wellbutrin  as this seems to have helped in the past.  Will restart this today and he will follow-up with us  in a few weeks.    Essential hypertension Initially elevated however at goal on recheck.  Overweight Patient has had limited ability to work on diet and exercise recently due to health of his son.  He has previously done well GLP agonist and would like to restart.  Will send prescription for Zepbound .  He is aware potential side effects.  Preventative Healthcare: Recently had labs-do not need to repeat today.  Up-to-date on vaccines.  Patient Counseling(The following topics were reviewed and/or handout was given):  -Nutrition: Stressed importance of moderation in sodium/caffeine intake, saturated fat and cholesterol, caloric balance, sufficient intake of fresh  fruits, vegetables, and fiber.  -Stressed the importance of regular exercise.   -Substance Abuse: Discussed cessation/primary prevention of tobacco, alcohol, or other drug use; driving or other dangerous activities under the influence; availability of treatment for abuse.   -Injury prevention: Discussed safety belts, safety helmets, smoke detector, smoking near bedding or upholstery.   -Sexuality: Discussed sexually transmitted diseases, partner selection, use of condoms, avoidance of unintended pregnancy and contraceptive alternatives.   -Dental health: Discussed importance of regular tooth brushing, flossing, and dental visits.  -Health maintenance and immunizations reviewed. Please refer to Health maintenance section.  Return to care in 1 year for next preventative visit.     Subjective:  HPI:  Patient here today for his annual physical. I last saw him about 6 months ago. Since our last visit he has been under significant amount of stress related to the health of his newborn son.  Son is currently admitted at [facility] due to complications from ischemic bowel of unclear etiology.  He has been spending most nights in the hospital.   He has also developed rash on his hands and elbows over the last several weeks.  He was given prescription for triamcinolone  by sports medicine a few weeks ago for this however this was not particularly effective.  Hydrocortisone has given him some relief.  Has also tried witch hazel with some improvement as well.  Unclear etiology though he believes it may be due to contact dermatitis from a cleaning solution or other itching at the hospital.  The rash is  very pruritic and will sometimes wake him up at night with intense itching.     06/11/2024    8:29 AM  Depression screen PHQ 2/9  Decreased Interest 1  Down, Depressed, Hopeless 2  PHQ - 2 Score 3  Altered sleeping 0  Tired, decreased energy 3  Change in appetite 0  Feeling bad or failure  about yourself  0  Trouble concentrating 2  Moving slowly or fidgety/restless 0  Suicidal thoughts 0  PHQ-9 Score 8  Difficult doing work/chores Somewhat difficult    There are no preventive care reminders to display for this patient.   ROS: Per HPI, otherwise a complete review of systems was negative.   PMH:  The following were reviewed and entered/updated in epic: Past Medical History:  Diagnosis Date   ADHD (attention deficit hyperactivity disorder)    Allergy    Hypertension    Migraine    Nasal septal deviation    Patient Active Problem List   Diagnosis Date Noted   Atopic dermatitis 05/28/2024   Adjustment disorder 12/12/2023   Chronic pain of left thumb 06/13/2023   Peroneal tendinitis of left lower extremity 06/13/2023   Overweight 05/30/2023   Family history of prostate cancer 05/30/2023   Posterior tibialis tendinitis of both lower extremities 11/01/2022   Hemorrhoid 09/14/2021   Trigger point of right shoulder region 08/03/2021   Somatic dysfunction of spine, cervical 07/06/2021   Chronic back pain 05/25/2021   ADHD 05/04/2021   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   Essential hypertension 05/16/2020   Seasonal allergies 05/16/2020   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   Past Surgical History:  Procedure Laterality Date   ADENOIDECTOMY     SALIVARY GLAND SURGERY     SEPTOPLASTY Bilateral 10/20/2023   Procedure: SEPTOPLASTY;  Surgeon: Jesus Oliphant, MD;  Location: [facility];  Service: ENT;  Laterality: Bilateral;   TONSILLECTOMY     TURBINATE REDUCTION Bilateral 10/20/2023   Procedure: TURBINATE REDUCTION;  Surgeon: Jesus Oliphant, MD;  Location: [facility];  Service: ENT;  Laterality: Bilateral;   WISDOM TOOTH EXTRACTION      Family History  Problem Relation Age of Onset   Allergic rhinitis Mother    Arthritis Mother    Hypertension Mother    Prostate cancer Father    Skin cancer Father    Cancer Father    Prostate cancer  Paternal Uncle    Stroke Paternal Grandmother    Prostate cancer Paternal Grandfather    Stroke Paternal Grandfather    Parkinson's disease Paternal Grandfather     Medications- reviewed and updated Current Outpatient Medications  Medication Sig Dispense Refill   buPROPion  (WELLBUTRIN  XL) 150 MG 24 hr tablet Take 1 tablet (150 mg total) by mouth daily. 90 tablet 3   clobetasol  ointment (TEMOVATE ) 0.05 % Apply 1 Application topically 2 (two) times daily. 30 g 0   Famotidine-Ca Carb-Mag Hydrox (PEPCID COMPLETE PO) Take by mouth.     losartan  (COZAAR ) 50 MG tablet Take 0.5 tablets (25 mg total) by mouth daily. 45 tablet 3   Multiple Vitamin (MULTIVITAMIN ADULT PO) Take by mouth.     permethrin  (ELIMITE ) 5 % cream Apply 1 Application topically once for 1 dose. 60 g 0   tirzepatide  (ZEPBOUND ) 2.5 MG/0.5ML Pen Inject 2.5 mg into the skin once a week. 2 mL 0   tiZANidine  (ZANAFLEX ) 4 MG tablet Take 1 tablet (4 mg total) by mouth every 6 (six) hours as needed for muscle spasms. 30 tablet  0   triamcinolone  cream (KENALOG ) 0.5 % Apply 1 Application topically 2 (two) times daily to affected areas. 30 g 3   Vitamin D , Ergocalciferol , (DRISDOL ) 1.25 MG (50000 UNIT) CAPS capsule Take 1 capsule (50,000 Units total) by mouth every 7 (seven) days. 12 capsule 0   lisdexamfetamine (VYVANSE ) 40 MG capsule Take 1 capsule (40 mg total) by mouth every morning. (Patient not taking: Reported on 06/11/2024) 30 capsule 0   No current facility-administered medications for this visit.    Allergies-reviewed and updated No Known Allergies  Social History   Socioeconomic History   Marital status: Married    Spouse name: Not on file   Number of children: Not on file   Years of education: Not on file   Highest education level: Professional school degree (e.g., MD, DDS, DVM, JD)  Occupational History   Occupation: Dentist  Tobacco Use   Smoking status: Never    Passive exposure: Never   Smokeless tobacco: Never   Vaping Use   Vaping status: Never Used  Substance and Sexual Activity   Alcohol use: Yes    Comment: Rarely   Drug use: Never   Sexual activity: Yes    Birth control/protection: None  Other Topics Concern   Not on file  Social History Narrative   Not on file   Social Drivers of Health   Financial Resource Strain: Low Risk  (06/11/2024)   Overall Financial Resource Strain (CARDIA)    Difficulty of Paying Living Expenses: Not hard at all  Food Insecurity: No Food Insecurity (06/11/2024)   Hunger Vital Sign    Worried About Running Out of Food in the Last Year: Never true    Ran Out of Food in the Last Year: Never true  Transportation Needs: No Transportation Needs (06/11/2024)   PRAPARE - Administrator, Civil Service (Medical): No    Lack of Transportation (Non-Medical): No  Physical Activity: Inactive (06/11/2024)   Exercise Vital Sign    Days of Exercise per Week: 0 days    Minutes of Exercise per Session: Not on file  Stress: Stress Concern Present (06/11/2024)   [employer] of Occupational Health - Occupational Stress Questionnaire    Feeling of Stress: Very much  Social Connections: Socially Isolated (06/11/2024)   Social Connection and Isolation Panel    Frequency of Communication with Friends and Family: Twice a week    Frequency of Social Gatherings with Friends and Family: Never    Attends Religious Services: Never    Database administrator or Organizations: No    Attends Engineer, structural: Not on file    Marital Status: Married        Objective:  Physical Exam: BP 130/89   Pulse 85   Temp (!) 97.4 F (36.3 C) (Temporal)   Ht 5' 11 (1.803 m)   Wt 189 lb 6.4 oz (85.9 kg)   SpO2 99%   BMI 26.42 kg/m   Body mass index is 26.42 kg/m. Wt Readings from Last 3 Encounters:  06/11/24 189 lb 6.4 oz (85.9 kg)  05/28/24 190 lb (86.2 kg)  04/30/24 190 lb (86.2 kg)   Gen: NAD, resting comfortably HEENT: TMs normal bilaterally. OP  clear. No thyromegaly noted.  CV: RRR with no murmurs appreciated Pulm: NWOB, CTAB with no crackles, wheezes, or rhonchi GI: Normal bowel sounds present. Soft, Nontender, Nondistended. MSK: no edema, cyanosis, or clubbing noted Skin: warm, dry.  Erythematous plaques on bilateral dorsal MCP joints  of both hands and extensor surface of elbows.  Excoriations noted. Neuro: CN2-12 grossly intact. Strength 5/5 in upper and lower extremities. Reflexes symmetric and intact bilaterally.  Psych: Normal affect and thought content     Riane Rung M. Kennyth, MD 06/11/2024 8:31 AM

## 2024-06-11 NOTE — Assessment & Plan Note (Signed)
 Patient has had limited ability to work on diet and exercise recently due to health of his son.  He has previously done well GLP agonist and would like to restart.  Will send prescription for Zepbound .  He is aware potential side effects.

## 2024-06-11 NOTE — Assessment & Plan Note (Signed)
 Initially elevated however at goal on recheck.

## 2024-06-14 ENCOUNTER — Other Ambulatory Visit (HOSPITAL_COMMUNITY): Payer: Self-pay

## 2024-06-14 ENCOUNTER — Telehealth: Payer: Self-pay

## 2024-06-14 NOTE — Telephone Encounter (Signed)
 Pharmacy Patient Advocate Encounter   Received notification from CoverMyMeds that prior authorization for Zepbound  2.5 is required/requested.   Insurance verification completed.   The patient is insured through [facility] .   Per test claim: PA required; PA submitted to above mentioned insurance via CoverMyMeds Key/confirmation #/EOC [id number] Status is pending

## 2024-06-14 NOTE — Telephone Encounter (Signed)
 Pharmacy Patient Advocate Encounter   Received notification from CoverMyMeds that prior authorization for Zepbound  2.5 is required/requested.   Insurance verification completed.   The patient is insured through [facility] .   Per test claim:

## 2024-06-23 ENCOUNTER — Encounter: Payer: Self-pay | Admitting: Family Medicine

## 2024-06-23 NOTE — Telephone Encounter (Signed)
 See note

## 2024-07-01 ENCOUNTER — Encounter: Payer: Self-pay | Admitting: Family Medicine

## 2024-07-01 ENCOUNTER — Other Ambulatory Visit (HOSPITAL_COMMUNITY): Payer: Self-pay

## 2024-07-01 MED ORDER — PREDNISONE 20 MG PO TABS
20.0000 mg | ORAL_TABLET | Freq: Every day | ORAL | 0 refills | Status: DC
  Filled 2024-07-01: qty 10, 10d supply, fill #0

## 2024-07-05 ENCOUNTER — Other Ambulatory Visit: Payer: Self-pay

## 2024-07-05 ENCOUNTER — Other Ambulatory Visit (HOSPITAL_COMMUNITY): Payer: Self-pay

## 2024-07-05 MED ORDER — PREDNISONE 20 MG PO TABS: 20.0000 mg | ORAL_TABLET | Freq: Every day | ORAL | 0 refills | Status: DC

## 2024-07-06 ENCOUNTER — Other Ambulatory Visit: Payer: Self-pay | Admitting: *Deleted

## 2024-07-06 DIAGNOSIS — L24B0 Irritant contact dermatitis related to unspecified stoma or fistula: Secondary | ICD-10-CM

## 2024-07-06 NOTE — Telephone Encounter (Signed)
 Dermatology Referral placed.

## 2024-07-06 NOTE — Telephone Encounter (Signed)
 Ok to refer to dermatology.  Worth HERO. Kennyth, MD 07/06/2024 12:51 PM

## 2024-07-06 NOTE — Telephone Encounter (Signed)
 See note

## 2024-07-07 ENCOUNTER — Other Ambulatory Visit: Payer: Self-pay | Admitting: *Deleted

## 2024-07-07 ENCOUNTER — Other Ambulatory Visit (HOSPITAL_COMMUNITY): Payer: Self-pay

## 2024-07-07 ENCOUNTER — Telehealth: Payer: Self-pay

## 2024-07-07 DIAGNOSIS — L24B0 Irritant contact dermatitis related to unspecified stoma or fistula: Secondary | ICD-10-CM

## 2024-07-07 NOTE — Telephone Encounter (Signed)
 Urgent referral placed

## 2024-07-07 NOTE — Telephone Encounter (Signed)
 Pharmacy Patient Advocate Encounter   Received notification from CoverMyMeds that prior authorization for buPROPion  HCl ER (XL) 150MG  er tablets  is required/requested.   Insurance verification completed.   The patient is insured through [facility] .   Per test claim: The current 90 day co-pay is, $0.00.  No PA needed at this time. This test claim was processed through [facility]- copay amounts may vary at other pharmacies due to pharmacy/plan contracts, or as the patient moves through the different stages of their insurance plan.

## 2024-07-14 ENCOUNTER — Ambulatory Visit (INDEPENDENT_AMBULATORY_CARE_PROVIDER_SITE_OTHER): Admitting: Dermatology

## 2024-07-14 ENCOUNTER — Encounter: Payer: Self-pay | Admitting: Dermatology

## 2024-07-14 DIAGNOSIS — L308 Other specified dermatitis: Secondary | ICD-10-CM

## 2024-07-14 DIAGNOSIS — L011 Impetiginization of other dermatoses: Secondary | ICD-10-CM | POA: Diagnosis not present

## 2024-07-14 DIAGNOSIS — L299 Pruritus, unspecified: Secondary | ICD-10-CM | POA: Diagnosis not present

## 2024-07-14 DIAGNOSIS — R21 Rash and other nonspecific skin eruption: Secondary | ICD-10-CM

## 2024-07-14 DIAGNOSIS — L309 Dermatitis, unspecified: Secondary | ICD-10-CM | POA: Diagnosis not present

## 2024-07-14 MED ORDER — DOXYCYCLINE HYCLATE 100 MG PO TABS: 100.0000 mg | ORAL_TABLET | Freq: Two times a day (BID) | ORAL | 0 refills | Status: AC

## 2024-07-14 MED ORDER — PREDNISONE 10 MG PO TABS
ORAL_TABLET | ORAL | 0 refills | Status: AC
Start: 2024-07-14 — End: 2024-07-30

## 2024-07-14 MED ORDER — MUPIROCIN 2 % EX OINT: 1.0000 | TOPICAL_OINTMENT | Freq: Two times a day (BID) | CUTANEOUS | 2 refills | Status: AC

## 2024-07-14 MED ORDER — MOMETASONE FUROATE 0.1 % EX CREA: TOPICAL_CREAM | CUTANEOUS | 2 refills | Status: DC

## 2024-07-14 NOTE — Progress Notes (Signed)
 New Patient Visit   Subjective  Jason [name] is a 43 y.o. male who presents for a NEW PATIENT appointment to be examined for the concerns as listed below.  Dermatitis: Patient stated rash started 3 weeks ago that started in a small area that has not spread to the arms & legs only affecting skin that is not covered by clothing.   Rx Prednisone  tabs, TMC, clobetasol  - used BID for 2 weeks. He got slight relief but Sx returmed. He has consistant itching ranging in severity from 5-10 out of 10 with is being intense overnight while sleeping. He is unable to hold his son as body heat, warm or hot showers or any type of heat and humidity makes it worse.  He mentioned that his 65mo year old son has been in [facility] for over [facility] days so most of his time is spend there and wonders if he may have came into contact with something they use for cleaning as he has not changed any self care products or laundry detergents.    Patient denied Hx of Bx. Patient reports family Hx of skin cancer (father - AKs).   The following portions of the chart were reviewed this encounter and updated as appropriate: medications, allergies, medical history  Review of Systems:  No other skin or systemic complaints except as noted in HPI or Assessment and Plan.  Objective  Well appearing patient in no apparent distress; mood and affect are within normal limits.   A focused examination was performed of the following areas: B/L arms & legs   Relevant exam findings are noted in the Assessment and Plan.                             Assessment & Plan   1. Unspecified Dermatitis with Pruritus - Assessment: Patient presents with severe, itchy rash on ankles, calves, and arms, accompanied by hair loss in affected areas. Rash has worsened and spread, becoming fire engine red despite previous treatments. Physical examination reveals pericallicular erythematous pustules and urticarial plaque.  Previous treatments including prednisone , clobetasol , and triamcinolone  have been ineffective or exacerbated the condition. Etiology unclear, necessitating further investigation.  - Plan:    Perform punch biopsy and culture of affected area    Swab for MRSA    Prescribe doxycycline  100 mg PO BID with meals    Prescribe prednisone  taper starting at 40 mg daily, tapering over extended course    Apply mometasone  cream mixed with mupirocin  ointment topically BID    Prescribe Zoryve (roflumilast) as non-steroidal anti-inflammatory    Recommend Benadryl for nighttime itching as needed    Continue Allegra as previously prescribed    Use ibuprofen for pain management as needed RASH Left Thigh - Anterior Skin / nail biopsy - Left Thigh - Anterior Type of biopsy: punch   Informed consent: discussed and consent obtained   Timeout: patient name, date of birth, surgical site, and procedure verified   Procedure prep:  Patient was prepped and draped in usual sterile fashion (the patient was cleaned and prepped) Prep type:  Isopropyl alcohol Anesthesia: the lesion was anesthetized in a standard fashion   Anesthetic:  1% lidocaine  w/ epinephrine  1-100,000 buffered w/ 8.4% NaHCO3 Punch size:  4 mm Suture size:  4-0 Suture type: nylon   Hemostasis achieved with: suture, pressure and aluminum chloride   Outcome: patient tolerated procedure well   Post-procedure details: sterile dressing applied  and wound care instructions given   Dressing type: bandage, petrolatum and pressure dressing     No follow-ups on file.   Documentation: I have reviewed the above documentation for accuracy and completeness, and I agree with the above.  I, Shirron Maranda, CMA, am acting as scribe for [employer], DO.   Delon Lenis, DO

## 2024-07-14 NOTE — Patient Instructions (Addendum)
 Date: Wed Jul 14 2024  Hello,  Thank you for visiting today. Here is a summary of the key instructions:  - Medications:   - Take doxycycline  100 mg twice a day with a big meal   - Start prednisone  40 mg, tapering down over a longer course   - Apply mometasone  cream mixed with mupirocin  ointment twice a day   - Take Benadryl at night if itching is severe   - Use ibuprofen for pain as needed  - Treatment Areas:   - A punch biopsy with suture was performed today  - Follow-up:   - Return in 2 weeks for biopsy results and suture removal   - Send a message on MyChart if symptoms worsen within a week  - Other Instructions:   - You can get the biopsy site wet in the shower   - Continue oatmeal baths as desired  Please reach out if you have any questions or concerns.  Warm regards,  Dr. Delon Lenis Dermatology     Important Information  Due to recent changes in healthcare laws, you may see results of your pathology and/or laboratory studies on MyChart before the doctors have had a chance to review them. We understand that in some cases there may be results that are confusing or concerning to you. Please understand that not all results are received at the same time and often the doctors may need to interpret multiple results in order to provide you with the best plan of care or course of treatment. Therefore, we ask that you please give us  2 business days to thoroughly review all your results before contacting the office for clarification. Should we see a critical lab result, you will be contacted sooner.   If You Need Anything After Your Visit  If you have any questions or concerns for your doctor, please call our main line at [phone] If no one answers, please leave a voicemail as directed and we will return your call as soon as possible. Messages left after 4 pm will be answered the following business day.   You may also send us  a message via MyChart. We typically respond  to MyChart messages within 1-2 business days.  For prescription refills, please ask your pharmacy to contact our office. Our fax number is [phone].  If you have an urgent issue when the clinic is closed that cannot wait until the next business day, you can page your doctor at the number below.    Please note that while we do our best to be available for urgent issues outside of office hours, we are not available 24/7.   If you have an urgent issue and are unable to reach us , you may choose to seek medical care at your doctor's office, retail clinic, urgent care center, or emergency room.  If you have a medical emergency, please immediately call 911 or go to the emergency department. In the event of inclement weather, please call our main line at [phone] for an update on the status of any delays or closures.  Dermatology Medication Tips: Please keep the boxes that topical medications come in in order to help keep track of the instructions about where and how to use these. Pharmacies typically print the medication instructions only on the boxes and not directly on the medication tubes.   If your medication is too expensive, please contact our office at [phone] or send us  a message through MyChart.   We are unable to tell what  your co-pay for medications will be in advance as this is different depending on your insurance coverage. However, we may be able to find a substitute medication at lower cost or fill out paperwork to get insurance to cover a needed medication.   If a prior authorization is required to get your medication covered by your insurance company, please allow us  1-2 business days to complete this process.  Drug prices often vary depending on where the prescription is filled and some pharmacies may offer cheaper prices.  The website www.goodrx.com contains coupons for medications through different pharmacies. The prices here do not account for what the cost may be  with help from insurance (it may be cheaper with your insurance), but the website can give you the price if you did not use any insurance.  - You can print the associated coupon and take it with your prescription to the pharmacy.  - You may also stop by our office during regular business hours and pick up a GoodRx coupon card.  - If you need your prescription sent electronically to a different pharmacy, notify our office through [facility] or by phone at [phone]

## 2024-07-15 ENCOUNTER — Encounter: Payer: Self-pay | Admitting: Dermatology

## 2024-07-15 NOTE — Addendum Note (Signed)
 Addended by: ALM DELON SAILOR on: 07/15/2024 08:21 AM   Modules accepted: Level of Service

## 2024-07-19 ENCOUNTER — Ambulatory Visit: Payer: Self-pay | Admitting: Dermatology

## 2024-07-19 LAB — SURGICAL PATHOLOGY

## 2024-07-19 LAB — SPECIMEN STATUS REPORT

## 2024-07-19 LAB — AEROBIC CULTURE

## 2024-07-20 NOTE — Progress Notes (Signed)
 Please call patient and let him know that yes, it is contagious until it heals.  The pustules should he healed or almost healed since he's been on the doxy for over 1 week.. Can you ask him if the rash is improving.  Also, please let him know the bx confirmed there's both a contact dermatitis and a bacterial infection (both of which he's currently on treatment for) Thanks!  -Dr D

## 2024-07-22 NOTE — Progress Notes (Deleted)
  [name] [address] [place] [address] Phone: [phone] Subjective:    I'm seeing this patient by the request  of:  Kennyth Worth HERO, MD  CC:   [name]  Jason [name] is a 43 y.o. male coming in with complaint of back and neck pain. OMT 05/28/2024. Patient states   Medications patient has been prescribed: Vit D  Taking:         Reviewed prior external information including notes and imaging from previsou exam, outside providers and external EMR if available.   As well as notes that were available from care everywhere and other healthcare systems.  Past medical history, social, surgical and family history all reviewed in electronic medical record.  No pertanent information unless stated regarding to the chief complaint.   Past Medical History:  Diagnosis Date   ADHD (attention deficit hyperactivity disorder)    Allergy    Hypertension    Migraine    Nasal septal deviation     No Known Allergies   Review of Systems:  No headache, visual changes, nausea, vomiting, diarrhea, constipation, dizziness, abdominal pain, skin rash, fevers, chills, night sweats, weight loss, swollen lymph nodes, body aches, joint swelling, chest pain, shortness of breath, mood changes. POSITIVE muscle aches  Objective  There were no vitals taken for this visit.   General: No apparent distress alert and oriented x3 mood and affect normal, dressed appropriately.  HEENT: Pupils equal, extraocular movements intact  Respiratory: Patient's speak in full sentences and does not appear short of breath  Cardiovascular: No lower extremity edema, non tender, no erythema  Gait MSK:  Back   Osteopathic findings  C2 flexed rotated and side bent right C6 flexed rotated and side bent left T3 extended rotated and side bent right inhaled rib T9 extended rotated and side bent left L2 flexed rotated and side bent right Sacrum right on right        Assessment and Plan:  No problem-specific Assessment & Plan notes found for this encounter.    Nonallopathic problems  Decision today to treat with OMT was based on Physical Exam  After verbal consent patient was treated with HVLA, ME, FPR techniques in cervical, rib, thoracic, lumbar, and sacral  areas  Patient tolerated the procedure well with improvement in symptoms  Patient given exercises, stretches and lifestyle modifications  See medications in patient instructions if given  Patient will follow up in 4-8 weeks             Note: This dictation was prepared with Dragon dictation along with smaller phrase technology. Any transcriptional errors that result from this process are unintentional.

## 2024-07-23 ENCOUNTER — Ambulatory Visit: Admitting: Family Medicine

## 2024-07-23 NOTE — Progress Notes (Signed)
 [name] [address] [place] [address] Phone: [phone] Subjective:   Jason [name], am serving as a scribe for Dr. Arthea [name].  I'[name] seeing this patient by the request  of:  Jason Worth HERO, MD  CC: back and neck pain   [name]:Jason [name]  Jason [name] is a 43 y.o. male coming in with complaint of back and neck pain. OMT 05/28/2024. Patient states that he has nerve pain over L hip that radiates down the L leg.   Also having T spine   Medications patient has been prescribed: Vit D  Taking:         Reviewed prior external information including notes and imaging from previsou exam, outside providers and external EMR if available.   As well as notes that were available from care everywhere and other healthcare systems.  Past medical history, social, surgical and family history all reviewed in electronic medical record.  No pertanent information unless stated regarding to the chief complaint.   Past Medical History:  Diagnosis Date   ADHD (attention deficit hyperactivity disorder)    Allergy    Hypertension    Migraine    Nasal septal deviation     No Known Allergies   Review of Systems:  No headache, visual changes, nausea, vomiting, diarrhea, constipation, dizziness, abdominal pain, skin rash, fevers, chills, night sweats, weight loss, swollen lymph nodes, body aches, joint swelling, chest pain, shortness of breath, mood changes. POSITIVE muscle aches  Objective  Blood pressure (!) 132/92, height 5' 11 (1.803 [name]), weight 194 lb (88 kg).   General: No apparent distress alert and oriented x3 mood and affect normal, dressed appropriately.  HEENT: Pupils equal, extraocular movements intact  Respiratory: Patient's speak in full sentences and does not appear short of breath  Cardiovascular: No lower extremity edema, non tender, no erythema  MSK:  Back significant tightness noted around the right sacroiliac joint.  FABER  test noted.  Some tightness noted in the paraspinal musculature of the lumbar spine near the lumbar thoracic area more than anywhere else.  Neck exam still has some significant tightness noted right greater left.  Difficulty with some sidebending.  Osteopathic findings  C2 flexed rotated and side bent right C7 flexed rotated and side bent right T3 extended rotated and side bent right inhaled rib T9 extended rotated and side bent left L4 flexed rotated and side bent right L5 flexed rotated and side bent left Sacrum right on right       Assessment and Plan:  Cervicogenic migraine Known arthritic changes, still caring for his child but has had significant health issues.  Now in the home and doing more of the lifting mechanics.  Discussed icing regimen and home exercises otherwise.  Discussed which activities to do and which ones to avoid.  Increase activity slowly.  Follow-up again in 6 to 8 weeks.    Nonallopathic problems  Decision today to treat with OMT was based on Physical Exam  After verbal consent patient was treated with HVLA, ME, FPR techniques in cervical, rib, thoracic, lumbar, and sacral  areas  Patient tolerated the procedure well with improvement in symptoms  Patient given exercises, stretches and lifestyle modifications  See medications in patient instructions if given  Patient will follow up in 4-8 weeks     The above documentation has been reviewed and is accurate and complete Jason [name] Jason Worland, DO         Note: This dictation was  prepared with Dragon dictation along with smaller phrase technology. Any transcriptional errors that result from this process are unintentional.

## 2024-07-27 ENCOUNTER — Ambulatory Visit (INDEPENDENT_AMBULATORY_CARE_PROVIDER_SITE_OTHER): Admitting: Family Medicine

## 2024-07-27 ENCOUNTER — Encounter: Payer: Self-pay | Admitting: Family Medicine

## 2024-07-27 VITALS — BP 132/92 | Ht 71.0 in | Wt 194.0 lb

## 2024-07-27 DIAGNOSIS — M9903 Segmental and somatic dysfunction of lumbar region: Secondary | ICD-10-CM | POA: Diagnosis not present

## 2024-07-27 DIAGNOSIS — M9904 Segmental and somatic dysfunction of sacral region: Secondary | ICD-10-CM

## 2024-07-27 DIAGNOSIS — M9908 Segmental and somatic dysfunction of rib cage: Secondary | ICD-10-CM | POA: Diagnosis not present

## 2024-07-27 DIAGNOSIS — M9901 Segmental and somatic dysfunction of cervical region: Secondary | ICD-10-CM | POA: Diagnosis not present

## 2024-07-27 DIAGNOSIS — M9902 Segmental and somatic dysfunction of thoracic region: Secondary | ICD-10-CM | POA: Diagnosis not present

## 2024-07-27 DIAGNOSIS — G43809 Other migraine, not intractable, without status migrainosus: Secondary | ICD-10-CM

## 2024-07-27 NOTE — Patient Instructions (Signed)
Good to see you! See you again in 6-8 weeks 

## 2024-07-27 NOTE — Assessment & Plan Note (Signed)
 Known arthritic changes, still caring for his child but has had significant health issues.  Now in the home and doing more of the lifting mechanics.  Discussed icing regimen and home exercises otherwise.  Discussed which activities to do and which ones to avoid.  Increase activity slowly.  Follow-up again in 6 to 8 weeks.

## 2024-07-29 ENCOUNTER — Encounter: Payer: Self-pay | Admitting: Dermatology

## 2024-07-29 ENCOUNTER — Other Ambulatory Visit (HOSPITAL_COMMUNITY): Payer: Self-pay

## 2024-07-29 ENCOUNTER — Ambulatory Visit: Admitting: Dermatology

## 2024-07-29 VITALS — BP 156/99 | HR 83

## 2024-07-29 DIAGNOSIS — L309 Dermatitis, unspecified: Secondary | ICD-10-CM | POA: Diagnosis not present

## 2024-07-29 DIAGNOSIS — L089 Local infection of the skin and subcutaneous tissue, unspecified: Secondary | ICD-10-CM

## 2024-07-29 DIAGNOSIS — L249 Irritant contact dermatitis, unspecified cause: Secondary | ICD-10-CM

## 2024-07-29 MED ORDER — MOMETASONE FUROATE 0.1 % EX CREA
1.0000 | TOPICAL_CREAM | Freq: Two times a day (BID) | CUTANEOUS | 6 refills | Status: AC
  Filled 2024-07-29: qty 45, 22d supply, fill #0
  Filled 2024-08-26: qty 45, 22d supply, fill #1

## 2024-07-29 NOTE — Patient Instructions (Addendum)
 Date: Thu Jul 29 2024  Hello Friend,  Thank you for visiting today. Here is a summary of the key instructions:  - Medications:   - Finish the prednisone  as prescribed   - Continue using mometasone  for 2 weeks on, 2 weeks off   - Use until no more itching or redness occurs  - Skin Care:   - Apply Aquaphor to affected areas   - Use Aveeno Baby Lotion or Aveeno Eczema Balm for dry skin   - Try CeraVe Anti-Itch Lotion, especially during off weeks from steroid use   - Mix Aveeno with prescription medication or apply one after the other   - During off weeks from steroids, use Aveeno or CeraVe Anti-Itch Lotion alone  - Follow-up:   - Return for follow-up as needed   - Expect skin to clear up in 6 to 8 weeks  - Other Instructions:   - Contact the office if you need anything or have questions   - Use MyChart for communication with the office  Please reach out if you have any questions or concerns.  Warm regards,  Dr. [facility]       Important Information   Due to recent changes in healthcare laws, you may see results of your pathology and/or laboratory studies on MyChart before the doctors have had a chance to review them. We understand that in some cases there may be results that are confusing or concerning to you. Please understand that not all results are received at the same time and often the doctors may need to interpret multiple results in order to provide you with the best plan of care or course of treatment. Therefore, we ask that you please give us  2 business days to thoroughly review all your results before contacting the office for clarification. Should we see a critical lab result, you will be contacted sooner.     If You Need Anything After Your Visit   If you have any questions or concerns for your doctor, please call our main line at [phone]. If no one answers, please leave a voicemail as directed and we will return your call as soon as  possible. Messages left after 4 pm will be answered the following business day.    You may also send us  a message via MyChart. We typically respond to MyChart messages within 1-2 business days.  For prescription refills, please ask your pharmacy to contact our office. Our fax number is [phone].  If you have an urgent issue when the clinic is closed that cannot wait until the next business day, you can page your doctor at the number below.     Please note that while we do our best to be available for urgent issues outside of office hours, we are not available 24/7.    If you have an urgent issue and are unable to reach us , you may choose to seek medical care at your doctor's office, retail clinic, urgent care center, or emergency room.   If you have a medical emergency, please immediately call 911 or go to the emergency department. In the event of inclement weather, please call our main line at [phone] for an update on the status of any delays or closures.  Dermatology Medication Tips: Please keep the boxes that topical medications come in in order to help keep track of the instructions about where and how to use these. Pharmacies typically print the medication instructions only on the boxes and not directly  on the medication tubes.   If your medication is too expensive, please contact our office at [phone] or send us  a message through MyChart.    We are unable to tell what your co-pay for medications will be in advance as this is different depending on your insurance coverage. However, we may be able to find a substitute medication at lower cost or fill out paperwork to get insurance to cover a needed medication.    If a prior authorization is required to get your medication covered by your insurance company, please allow us  1-2 business days to complete this process.   Drug prices often vary depending on where the prescription is filled and some pharmacies may offer cheaper  prices.   The website www.goodrx.com contains coupons for medications through different pharmacies. The prices here do not account for what the cost may be with help from insurance (it may be cheaper with your insurance), but the website can give you the price if you did not use any insurance.  - You can print the associated coupon and take it with your prescription to the pharmacy.  - You may also stop by our office during regular business hours and pick up a GoodRx coupon card.  - If you need your prescription sent electronically to a different pharmacy, notify our office through [facility] or by phone at [phone]

## 2024-07-29 NOTE — Progress Notes (Signed)
 Follow-Up Visit   Subjective  Jason [name] is a 43 y.o. male who presents for the following: BX Results and Suture Removal  Patient present today for follow up visit for BX Results and Suture Removal. Patient was last evaluated on 07/14/24. At this visit patient was prescribed mometasone  0.1% cream, Mupirocin , Doxycycline  and Prednisone . Patient reports sxs are Improving but not at goal. Patient denies medication changes. Patient rates his itch today 5 out of 10.  The following portions of the chart were reviewed this encounter and updated as appropriate: medications, allergies, medical history  Review of Systems:  No other skin or systemic complaints except as noted in HPI or Assessment and Plan.  Objective  Well appearing patient in no apparent distress; mood and affect are within normal limits.  A full examination was performed including scalp, head, eyes, ears, nose, lips, neck, chest, axillae, abdomen, back, buttocks, bilateral upper extremities, bilateral lower extremities, hands, feet, fingers, toes, fingernails, and toenails. All findings within normal limits unless otherwise noted below.   Relevant exam findings are noted in the Assessment and Plan.                    Component Ref Range & Units (hover) 2 wk ago  Aerobic Bacterial Culture Final report Abnormal   Organism ID, Bacteria Staphylococcus aureus Abnormal   Comment: Based on susceptibility to oxacillin this isolate would be susceptible to: *Penicillinase-stable penicillins, such as:   Cloxacillin, Dicloxacillin, Nafcillin *Beta-lactam combination agents, such as:   Amoxicillin -clavulanic acid, Ampicillin-sulbactam,   Piperacillin-tazobactam *Oral cephems, such as:   Cefaclor, Cefdinir, Cefpodoxime, Cefprozil, Cefuroxime,   Cephalexin , Loracarbef *Parenteral cephems, such as:   Cefazolin, Cefepime, Cefotaxime, Cefotetan, Ceftaroline,   Ceftizoxime, Ceftriaxone, Cefuroxime *Carbapenems, such as:    Doripenem, Ertapenem, Imipenem, Meropenem Light growth       REPORT OF DERMATOPATHOLOGY FINAL DIAGNOSIS and MICROSCOPIC DESCRIPTION Diagnosis Skin , left thigh - anterior SPONGIOTIC VESICULAR DERMATITIS, IMPETIGINIZED, ULCERATED Microscopic Description There is marked spongiosis with intraepidermal vesiculation, prominent subepidermal edema, and a mixed cell infiltrate containing eosinophils. The differential diagnosis includes vesicular contact dermatitis or id reaction, and less likely, vesicular responses to an arthropod assault. There is a purulent scale crust containing bacterial cocci, suggesting an impetiginized lesion. There is ulceration of the epidermis and an inflammatory infiltrate in the ulcer base. A special stain (PAS-F) is performed to determine the presence or absence of fungal hyphae in this biopsy. The PAS stain is negative for fungal hyphae, and the controls stained appropriately. Gram highlights impetiginized Gram positive coloinies. NATALIE   Assessment & Plan   1. Dermatitis with Secondary Staph Infection - Assessment: Patient diagnosed with dermatitis complicated by secondary staph infection, confirmed by biopsy. Infection not MRSA, sensitive to tetracycline but resistant to penicillin. Treatment with doxycycline  hyclate and topical mupirocin  effective. Inflammation significantly subsided, though patient reports increased itching when skin feels dry.  - Plan:    Continue doxycycline  hyclate    Continue mometasone  topical steroid on two-weeks-on, two-weeks-off schedule    During steroid-free weeks, use CeraVe Anti-Itch Lotion containing ceramide and pramoxine    Apply Aveeno Baby Lotion or Aveeno Eczema Balm for daily moisturizing    Complete current course of oral prednisone     Continue two-weeks-on, two-weeks-off steroid regimen until symptoms resolve  Follow-up as needed; condition expected to clear in 6-8 weeks.  IRRITANT CONTACT DERMATITIS, UNSPECIFIED  TRIGGER   Related Medications mometasone  (ELOCON ) 0.1 % cream Apply to affected areas twice a day for  2 weeks then STOP and take a break for 2 weeks.  Return if symptoms worsen or fail to improve.  I, Jetta Ager, am acting as Neurosurgeon for [employer], DO.  Documentation: I have reviewed the above documentation for accuracy and completeness, and I agree with the above.  Delon Lenis, DO

## 2024-08-04 ENCOUNTER — Encounter: Payer: Self-pay | Admitting: Family Medicine

## 2024-08-05 ENCOUNTER — Other Ambulatory Visit (HOSPITAL_COMMUNITY): Payer: Self-pay

## 2024-08-05 MED ORDER — DIAZEPAM 2 MG PO TABS
1.0000 mg | ORAL_TABLET | Freq: Two times a day (BID) | ORAL | 0 refills | Status: DC | PRN
  Filled 2024-08-05: qty 30, 30d supply, fill #0

## 2024-08-05 NOTE — Telephone Encounter (Signed)
 See note

## 2024-08-09 ENCOUNTER — Other Ambulatory Visit (HOSPITAL_COMMUNITY): Payer: Self-pay

## 2024-08-26 ENCOUNTER — Other Ambulatory Visit (HOSPITAL_COMMUNITY): Payer: Self-pay

## 2024-09-17 ENCOUNTER — Ambulatory Visit: Admitting: Family Medicine

## 2024-09-29 ENCOUNTER — Encounter: Payer: Self-pay | Admitting: Family Medicine

## 2024-09-30 ENCOUNTER — Other Ambulatory Visit (HOSPITAL_COMMUNITY): Payer: Self-pay

## 2024-09-30 MED ORDER — BUPROPION HCL ER (XL) 150 MG PO TB24
300.0000 mg | ORAL_TABLET | Freq: Every day | ORAL | 3 refills | Status: AC
  Filled 2024-09-30 – 2024-11-12 (×2): qty 180, 90d supply, fill #0

## 2024-09-30 MED ORDER — DIAZEPAM 2 MG PO TABS
1.0000 mg | ORAL_TABLET | Freq: Two times a day (BID) | ORAL | 5 refills | Status: AC | PRN
  Filled 2024-09-30: qty 30, 30d supply, fill #0
  Filled 2024-12-11: qty 30, 30d supply, fill #1

## 2024-10-01 ENCOUNTER — Ambulatory Visit: Admitting: Family Medicine

## 2024-10-04 ENCOUNTER — Other Ambulatory Visit (HOSPITAL_COMMUNITY): Payer: Self-pay

## 2024-10-25 ENCOUNTER — Ambulatory Visit: Admitting: Family Medicine

## 2024-11-12 ENCOUNTER — Other Ambulatory Visit: Payer: Self-pay

## 2024-12-12 ENCOUNTER — Other Ambulatory Visit: Payer: Self-pay

## 2025-01-05 NOTE — Progress Notes (Unsigned)
 " [name] [address] [place] [address] Phone: [phone] Subjective:   [name], am serving as a scribe for Dr. Arthea Claudene.  I'm seeing this patient by the request  of:  Kennyth Worth HERO, MD  CC: Multiple joint complaints  [name]  Jason [name] is a 44 y.o. male coming in with complaint of back and neck pain. OMT on 07/27/2024. Also having L shoulder pain. Patient states he is having pain over bicep tendon in the shoulder especially with shoulder flexed. Also notes knot under R scapula.   L knee is locking. Knee will pop and then he will have soreness. Pain over lateral aspect.   Back is sore.   Medications patient has been prescribed:   Taking:         Reviewed prior external information including notes and imaging from previsou exam, outside providers and external EMR if available.   As well as notes that were available from care everywhere and other healthcare systems.  Past medical history, social, surgical and family history all reviewed in electronic medical record.  No pertanent information unless stated regarding to the chief complaint.   Past Medical History:  Diagnosis Date   ADHD (attention deficit hyperactivity disorder)    Allergy    Hypertension    Migraine    Nasal septal deviation     Allergies[1]   Review of Systems:  No headache, visual changes, nausea, vomiting, diarrhea, constipation, dizziness, abdominal pain, skin rash, fevers, chills, night sweats, weight loss, swollen lymph nodes, body aches, joint swelling, chest pain, shortness of breath, mood changes. POSITIVE muscle aches  Objective  Blood pressure 114/76, height 5' 11 (1.803 m), weight 191 lb (86.6 kg).   General: No apparent distress alert and oriented x3 mood and affect normal, dressed appropriately.  HEENT: Pupils equal, extraocular movements intact  Respiratory: Patient's speak in full sentences and does not appear  short of breath  Cardiovascular: No lower extremity edema, non tender, no erythema  Gait MSK:  Back does have some loss of lordosis.  Neck exam does have significant stiffness noted.  Right shoulder positive crossover.  Rotator cuff strength is intact. Left knee positive patellar grind noted.  Some lateral apprehension test noted.  Negative McMurray's.  ACL is intact  Osteopathic findings  C2 flexed rotated and side bent right C6 flexed rotated and side bent left C7 F RS right  T3 extended rotated and side bent right inhaled rib T9 extended rotated and side bent left L2 flexed rotated and side bent right Sacrum right on right   Limited muscular skeletal ultrasound was performed and interpreted by CLAUDENE ARTHEA, M  Limited ultrasound of patient's left knee shows there is some hypoechoic changes in the patellofemoral joint.  Mild narrowing of the superior lateral patellofemoral joint.  Meniscus appear to be fairly unremarkable except for mild degenerative changes of the medial meniscus.  Procedure: Real-time Ultrasound Guided Injection of left acromioclavicular joint Device: GE Logiq Q7 Ultrasound guided injection is preferred based studies that show increased duration, increased effect, greater accuracy, decreased procedural pain, increased response rate, and decreased cost with ultrasound guided versus blind injection.  Verbal informed consent obtained.  Time-out conducted.  Noted no overlying erythema, induration, or other signs of local infection.  Skin prepped in a sterile fashion.  Local anesthesia: Topical Ethyl chloride.  With sterile technique and under real time ultrasound guidance: With a 25-gauge half inch needle injected with 0.5 cc of  0.5% Marcaine  and 0.5 cc of Kenalog  40 mg/mL Completed without difficulty  Pain immediately resolved suggesting accurate placement of the medication.  Advised to call if fevers/chills, erythema, induration, drainage, or persistent bleeding.   Impression: Technically successful ultrasound guided injection.    Assessment and Plan:  Arthritis of left acromioclavicular joint Left shoulder given injection today.  Mild hypoechoic changes also consistent with some bursitis that we will need to monitor.  Likely secondary to holding his sun on a regular basis.  Follow-up with me again in 6 to 12 weeks otherwise.  Lateral subluxation of left patella Discussed with patient about icing regimen and home exercises, which activities to do and which ones to avoid.  Increase activity slowly.  Discussed icing regimen.  Follow-up again in 6 to 12 weeks.  Cervicogenic migraine Chronic problem with exacerbation recently.  Likely other new or problems has caused more exacerbation of the chronic problem.  Discussed icing regimen and home exercises, discussed which activities to do.  Patient will continue to work on posture and ergonomics throughout the day and at work.  Follow-up with me again in 6 to 12 weeks    Nonallopathic problems  Decision today to treat with OMT was based on Physical Exam  After verbal consent patient was treated with HVLA, ME, FPR techniques in cervical, rib, thoracic, lumbar, and sacral  areas  Patient tolerated the procedure well with improvement in symptoms  Patient given exercises, stretches and lifestyle modifications  See medications in patient instructions if given  Patient will follow up in 4-8 weeks     The above documentation has been reviewed and is accurate and complete Jason Coburn M Zvi Duplantis, DO         Note: This dictation was prepared with Dragon dictation along with smaller phrase technology. Any transcriptional errors that result from this process are unintentional.            [1] No Known Allergies  "

## 2025-01-06 ENCOUNTER — Ambulatory Visit: Admitting: Family Medicine

## 2025-01-06 ENCOUNTER — Encounter: Payer: Self-pay | Admitting: Family Medicine

## 2025-01-06 ENCOUNTER — Other Ambulatory Visit: Payer: Self-pay

## 2025-01-06 ENCOUNTER — Ambulatory Visit

## 2025-01-06 VITALS — BP 114/76 | Ht 71.0 in | Wt 191.0 lb

## 2025-01-06 DIAGNOSIS — M9902 Segmental and somatic dysfunction of thoracic region: Secondary | ICD-10-CM

## 2025-01-06 DIAGNOSIS — M9903 Segmental and somatic dysfunction of lumbar region: Secondary | ICD-10-CM | POA: Diagnosis not present

## 2025-01-06 DIAGNOSIS — S83012A Lateral subluxation of left patella, initial encounter: Secondary | ICD-10-CM | POA: Diagnosis not present

## 2025-01-06 DIAGNOSIS — M19012 Primary osteoarthritis, left shoulder: Secondary | ICD-10-CM | POA: Diagnosis not present

## 2025-01-06 DIAGNOSIS — M25562 Pain in left knee: Secondary | ICD-10-CM | POA: Diagnosis not present

## 2025-01-06 DIAGNOSIS — M25512 Pain in left shoulder: Secondary | ICD-10-CM

## 2025-01-06 DIAGNOSIS — M9904 Segmental and somatic dysfunction of sacral region: Secondary | ICD-10-CM | POA: Diagnosis not present

## 2025-01-06 DIAGNOSIS — M9908 Segmental and somatic dysfunction of rib cage: Secondary | ICD-10-CM | POA: Diagnosis not present

## 2025-01-06 DIAGNOSIS — G43809 Other migraine, not intractable, without status migrainosus: Secondary | ICD-10-CM | POA: Diagnosis not present

## 2025-01-06 DIAGNOSIS — M9901 Segmental and somatic dysfunction of cervical region: Secondary | ICD-10-CM

## 2025-01-06 NOTE — Assessment & Plan Note (Signed)
 Chronic problem with exacerbation recently.  Likely other new or problems has caused more exacerbation of the chronic problem.  Discussed icing regimen and home exercises, discussed which activities to do.  Patient will continue to work on posture and ergonomics throughout the day and at work.  Follow-up with me again in 6 to 12 weeks

## 2025-01-06 NOTE — Assessment & Plan Note (Signed)
 Left shoulder given injection today.  Mild hypoechoic changes also consistent with some bursitis that we will need to monitor.  Likely secondary to holding his sun on a regular basis.  Follow-up with me again in 6 to 12 weeks otherwise.

## 2025-01-06 NOTE — Assessment & Plan Note (Signed)
 Discussed with patient about icing regimen and home exercises, which activities to do and which ones to avoid.  Increase activity slowly.  Discussed icing regimen.  Follow-up again in 6 to 12 weeks.

## 2025-01-06 NOTE — Patient Instructions (Addendum)
 Xray  Exercises Ice See me again in 6-8 weeks

## 2025-02-14 ENCOUNTER — Ambulatory Visit: Admitting: Dermatology

## 2025-02-24 ENCOUNTER — Ambulatory Visit: Admitting: Family Medicine

## 2025-06-17 ENCOUNTER — Encounter: Admitting: Family Medicine
